# Patient Record
Sex: Female | Born: 1960
Health system: Southern US, Community
[De-identification: ages and names within clinical notes are randomized; demographics above are authoritative.]

## PROBLEM LIST (undated history)

## (undated) DIAGNOSIS — T4145XA Adverse effect of unspecified anesthetic, initial encounter: Secondary | ICD-10-CM

## (undated) DIAGNOSIS — K8020 Calculus of gallbladder without cholecystitis without obstruction: Secondary | ICD-10-CM

## (undated) DIAGNOSIS — G4700 Insomnia, unspecified: Secondary | ICD-10-CM

## (undated) DIAGNOSIS — F32A Depression, unspecified: Secondary | ICD-10-CM

## (undated) DIAGNOSIS — F329 Major depressive disorder, single episode, unspecified: Secondary | ICD-10-CM

## (undated) DIAGNOSIS — G43909 Migraine, unspecified, not intractable, without status migrainosus: Secondary | ICD-10-CM

## (undated) DIAGNOSIS — E785 Hyperlipidemia, unspecified: Secondary | ICD-10-CM

## (undated) DIAGNOSIS — Z90711 Acquired absence of uterus with remaining cervical stump: Secondary | ICD-10-CM

## (undated) DIAGNOSIS — N390 Urinary tract infection, site not specified: Secondary | ICD-10-CM

## (undated) DIAGNOSIS — D649 Anemia, unspecified: Secondary | ICD-10-CM

## (undated) DIAGNOSIS — T8859XA Other complications of anesthesia, initial encounter: Secondary | ICD-10-CM

## (undated) HISTORY — PX: MYOMECTOMY: SHX85

## (undated) HISTORY — PX: LAPAROSCOPIC HYSTERECTOMY: SHX1926

## (undated) HISTORY — PX: TONSILLECTOMY: SUR1361

## (undated) HISTORY — DX: Hyperlipidemia, unspecified: E78.5

## (undated) HISTORY — DX: Major depressive disorder, single episode, unspecified: F32.9

## (undated) HISTORY — PX: OTHER SURGICAL HISTORY: SHX169

## (undated) HISTORY — DX: Anemia, unspecified: D64.9

## (undated) HISTORY — PX: ABDOMINAL HYSTERECTOMY: SHX81

## (undated) HISTORY — PX: BLEPHAROPLASTY: SUR158

## (undated) HISTORY — PX: WISDOM TOOTH EXTRACTION: SHX21

## (undated) HISTORY — DX: Insomnia, unspecified: G47.00

## (undated) HISTORY — DX: Urinary tract infection, site not specified: N39.0

## (undated) HISTORY — PX: POLYPECTOMY: SHX149

## (undated) HISTORY — DX: Depression, unspecified: F32.A

---

## 2003-09-05 ENCOUNTER — Other Ambulatory Visit: Admission: RE | Admit: 2003-09-05 | Discharge: 2003-09-05 | Payer: Self-pay | Admitting: *Deleted

## 2005-07-18 ENCOUNTER — Inpatient Hospital Stay (HOSPITAL_COMMUNITY): Admission: RE | Admit: 2005-07-18 | Discharge: 2005-07-22 | Payer: Self-pay | Admitting: Psychiatry

## 2005-07-18 ENCOUNTER — Emergency Department (HOSPITAL_COMMUNITY): Admission: EM | Admit: 2005-07-18 | Discharge: 2005-07-18 | Payer: Self-pay | Admitting: Emergency Medicine

## 2005-07-19 ENCOUNTER — Ambulatory Visit: Payer: Self-pay | Admitting: Psychiatry

## 2010-11-14 ENCOUNTER — Ambulatory Visit: Payer: Self-pay | Admitting: Family Medicine

## 2010-12-12 ENCOUNTER — Ambulatory Visit: Payer: Commercial Managed Care - PPO | Admitting: Family Medicine

## 2010-12-12 NOTE — Patient Instructions (Addendum)
-   Continue THREE meals a day and 1-2 snacks.  No more than 5 hours without eating.  - Continue your exercise routine, which is great! - Goal:  Obtain twice as many veg's as protein or carbohydrate foods for both lunch and dinner.   Salads, soups,  - Keys to success (veg's):  Keeping well stocked (carrots, cabbage), frozen veg's, chicken broth ([place] low-sodium), canned soup; commit to veg's X times per week (including X time per week of a new veg).   - List veg's you might be willing to try if prepared a certain way and in small quantities.  Work from this list to have a new veg 3 X wk.   - Suggestion:  Stir-fry kale or chard in olive oil and garlic.  Also use your microwave for veg's, and roast veg's sometimes (400 degrees, sprayed with oil &/or covered).   - Make a list of 7 meals that are easy to prepare, taste good, and incorporate veg's.  Use this list as a base for shopping.  Keep in the inside of a kitchen cupboard or on the refrigerator.   - Aim for fish 2 X wk.   - Recommendation:  WILD frozen blueberries.

## 2010-12-12 NOTE — Progress Notes (Signed)
Medical Nutrition Therapy:  Appt start time: 1130 end time:  1230.  Assessment:  Primary concerns today: cholesterol levels. Started with behavior changes Dec 19, after receiving lipid panel results from [facility] health screening.  Changes have included consuming 64 oz water/day, eating more protein and more veg's, increasing sleep, and exercising.   Usual eating pattern includes Meal 3 and 2+ snacks per day.    Avoided foods include: some veg's.  , but Acacia is working on this.   Usual physical activity includes personal trainer for 30 min resistance training 2 X wk, 60 min cardio 4 X wk, 45 min yoga 1 X wk, and aims for 10,000 steps/day.    Progress Towards Goal(s):  In progress.   Nutritional Diagnosis:  NI-5.8.3 Inappropriate intake of types of carbohydrates (specify):  As related to vegetables.  As evidenced by vegetable intake of no more than one/day on average.   Intervention:  Nutrition Counseling.    Monitoring/Evaluation:  Dietary intake, body weight, and physical activity in 1 month.

## 2011-01-06 ENCOUNTER — Ambulatory Visit (INDEPENDENT_AMBULATORY_CARE_PROVIDER_SITE_OTHER): Payer: Commercial Managed Care - PPO | Admitting: Family Medicine

## 2011-01-06 ENCOUNTER — Encounter: Payer: Self-pay | Admitting: Family Medicine

## 2011-01-06 DIAGNOSIS — D509 Iron deficiency anemia, unspecified: Secondary | ICD-10-CM

## 2011-01-06 DIAGNOSIS — G4700 Insomnia, unspecified: Secondary | ICD-10-CM

## 2011-01-06 DIAGNOSIS — F341 Dysthymic disorder: Secondary | ICD-10-CM

## 2011-01-06 DIAGNOSIS — E785 Hyperlipidemia, unspecified: Secondary | ICD-10-CM

## 2011-01-06 DIAGNOSIS — F329 Major depressive disorder, single episode, unspecified: Secondary | ICD-10-CM

## 2011-01-06 DIAGNOSIS — G2581 Restless legs syndrome: Secondary | ICD-10-CM

## 2011-01-06 DIAGNOSIS — Z87440 Personal history of urinary (tract) infections: Secondary | ICD-10-CM

## 2011-01-07 ENCOUNTER — Other Ambulatory Visit: Payer: Commercial Managed Care - PPO

## 2011-01-07 NOTE — Progress Notes (Signed)
**Note Megan-Identified via Obfuscation** There were no orders in for Megan Martin, we paged Megan Martin and before she called back, Megan Martin stated she had to leave in order to go to work. So no labs were drawn for this pt. AC

## 2011-01-12 ENCOUNTER — Encounter: Payer: Self-pay | Admitting: Family Medicine

## 2011-01-12 DIAGNOSIS — F329 Major depressive disorder, single episode, unspecified: Secondary | ICD-10-CM | POA: Insufficient documentation

## 2011-01-12 DIAGNOSIS — Z87440 Personal history of urinary (tract) infections: Secondary | ICD-10-CM | POA: Insufficient documentation

## 2011-01-12 DIAGNOSIS — G4700 Insomnia, unspecified: Secondary | ICD-10-CM | POA: Insufficient documentation

## 2011-01-12 DIAGNOSIS — E785 Hyperlipidemia, unspecified: Secondary | ICD-10-CM | POA: Insufficient documentation

## 2011-01-12 DIAGNOSIS — D509 Iron deficiency anemia, unspecified: Secondary | ICD-10-CM | POA: Insufficient documentation

## 2011-01-12 DIAGNOSIS — G2581 Restless legs syndrome: Secondary | ICD-10-CM | POA: Insufficient documentation

## 2011-01-12 NOTE — Progress Notes (Signed)
  Subjective:    Patient ID: Megan Martin, female    DOB: 19-Dec-1960, 50 y.o.   MRN: [id number]  HPI Patient is here to meet new PCP and discuss hyperlipidemia.  Patient was referred to me by Dr. Gerilyn Pilgrim.  She recently had FLP done at her work which showed LDL 156, TC 227, HDL 58, and T 67.  Patient wants to establish a PCP to manage chronic issues.  Patient is working with Dr. Gerilyn Pilgrim to manage diet and lifestyle modification.  She does not want to start medication at this time.  She wants to lose weight and recheck FLP in 6 months.  Patient has an established gynecologist and psychologist.      Review of Systems Denies fever, chills, sweats, chest pain, N/V.  Denies abdominal pain, myalgias, constipation/diarrhea, dysuria.  Denies any recent hospitalizations, ED visits.    Objective:   Physical Exam  Constitutional: She appears well-developed and well-nourished.  HENT:  Head: Normocephalic and atraumatic.  Cardiovascular: Normal rate and regular rhythm.  Exam reveals no gallop and no friction rub.   No murmur heard. Pulmonary/Chest: Effort normal and breath sounds normal. She has no wheezes. She has no rales.  Abdominal: Soft. Bowel sounds are normal. She exhibits no distension. There is no tenderness.  Skin: Skin is warm and dry.          Assessment & Plan:

## 2011-01-12 NOTE — Assessment & Plan Note (Signed)
Discussed lipid panel results with patient.  She voices understanding.  States that she plans on eating healthy foods and increasing exercise.  Says she has gained >25 lbs in 1 year and wants to lose weight.  Will recheck FLP in 6 months.  May consider starting low dose statin at that time if cholesterol does not improve.

## 2011-01-13 ENCOUNTER — Encounter: Payer: Self-pay | Admitting: Family Medicine

## 2011-01-13 ENCOUNTER — Ambulatory Visit (INDEPENDENT_AMBULATORY_CARE_PROVIDER_SITE_OTHER): Payer: Commercial Managed Care - PPO | Admitting: Family Medicine

## 2011-01-13 DIAGNOSIS — E785 Hyperlipidemia, unspecified: Secondary | ICD-10-CM

## 2011-01-13 NOTE — Progress Notes (Signed)
Medical Nutrition Therapy:  Appt start time: 1530 end time: 1630.  Assessment:  Primary concerns today: cholesterol levels. Dennys has been getting mostly salads/veg plate when out in restaurants.  She has been successful in getting fish at least once, rather than twice a week.  She has been seeing her trainer for weights 30 min 2 X wk, walk/run/cardio machines 4 X wk.  Still drinking plenty of water, but has not managed to eat veg's more than ~once a day on average.    Progress Towards Goal(s):  In progress.   Nutritional Diagnosis:  NI-5.8.3 Inappropriate intake of types of carbohydrates (specify):  As related to vegetables.  As evidenced by vegetable intake of no more than one/day on average.   Intervention:  Nutrition Counseling.    Monitoring/Evaluation:  Dietary intake, body weight, and physical activity in 1 month.

## 2011-01-13 NOTE — Patient Instructions (Addendum)
-   When you use soup (canned or box), always add frozen veg's to the soup.  (Microwave veg's, then add soup, then reheat.) - Keep frozen veg's on hand both at home and work.   - Goal:  Veg's at least once a day, and twice a day 3 X wk.  Record each day's total in the lower right corner.   - Keep tuna fish on hand (pouch or canned) to add to a salad.  - Protein RDA:  0.8 g per kg body weight = ~50 grams per day.  (Each oz of meat/fish = 7 g protein.) - Other recommended amounts:  No more than 45 g per day (olive/canola oil, nuts, seeds, avocado, fish). - Carbohydrates round out the rest of your diet, i.e., at 2000 kcal:  350 kcal from carb.  Most important is to watch the quality of these carb's.  Best sources of carbohydrate:  Fruit, sweet potatoes and other root veg's (white or sweet potatoes, turnips, carrots/parsnips, onion, garlic, beets).  Try bite-size pieces of veg's; spray with olive oil; roast at 375 or 400 degrees till soft.  Stiff occasionally.  Optional is addn of 1/2 c white wine & 1/4 c balsamic vinegar.   - List of 7-10 meals.

## 2011-02-10 ENCOUNTER — Ambulatory Visit: Payer: Commercial Managed Care - PPO | Admitting: Family Medicine

## 2011-02-19 ENCOUNTER — Other Ambulatory Visit: Payer: Self-pay | Admitting: Family Medicine

## 2011-02-19 DIAGNOSIS — E669 Obesity, unspecified: Secondary | ICD-10-CM

## 2011-02-19 NOTE — Telephone Encounter (Signed)
Pt has never had refills from [facility] and needs 3 refilled- Klonopin 0.5 1-2 as needed at night, Wellbutrin, & pristiq - [facility]  Also was told she needed fasting labs and there are no orders in for her.  Need to call her back when orders are in to sched lab.

## 2011-02-19 NOTE — Telephone Encounter (Signed)
Will forward message to Dr. Tye Savoy.

## 2011-02-21 MED ORDER — CLONAZEPAM 0.5 MG PO TABS: 0.5000 mg | ORAL_TABLET | Freq: Every evening | ORAL | Status: DC | PRN

## 2011-02-21 MED ORDER — BUPROPION HCL ER (SR) 150 MG PO TB12: 150.0000 mg | ORAL_TABLET | Freq: Every day | ORAL | Status: DC

## 2011-02-21 MED ORDER — DESVENLAFAXINE SUCCINATE ER 50 MG PO TB24: 50.0000 mg | ORAL_TABLET | Freq: Every day | ORAL | Status: DC

## 2011-02-21 NOTE — Telephone Encounter (Signed)
Addended by: Tye Savoy, IVY on: 02/21/2011 04:07 PM   Modules accepted: Orders

## 2011-02-21 NOTE — Telephone Encounter (Signed)
Addended by: Tye Savoy, IVY on: 02/21/2011 01:44 PM   Modules accepted: Orders

## 2011-02-21 NOTE — Telephone Encounter (Signed)
Addended by: Tye Savoy, Mystic Labo on: 02/21/2011 01:45 PM   Modules accepted: Orders

## 2011-02-21 NOTE — Telephone Encounter (Signed)
FLP ordered.  Meds e-scribed to outpatient pharmacy.  Please let patient know this is all done.  Thanks.

## 2011-02-24 ENCOUNTER — Other Ambulatory Visit: Payer: Commercial Managed Care - PPO

## 2011-02-25 ENCOUNTER — Other Ambulatory Visit: Payer: Commercial Managed Care - PPO

## 2011-02-25 DIAGNOSIS — E669 Obesity, unspecified: Secondary | ICD-10-CM

## 2011-02-25 LAB — LIPID PANEL
Cholesterol: 256 mg/dL — ABNORMAL HIGH (ref 0–200)
HDL: 55 mg/dL (ref >39)
LDL Cholesterol: 187 mg/dL — ABNORMAL HIGH (ref 0–99)
Total CHOL/HDL Ratio: 4.7 Ratio
Triglycerides: 69 mg/dL (ref <150)
VLDL: 14 mg/dL (ref 0–40)

## 2011-02-25 NOTE — Progress Notes (Signed)
FLP DONE TODAY Mirren Gest 

## 2011-02-26 ENCOUNTER — Telehealth: Payer: Self-pay | Admitting: Family Medicine

## 2011-02-26 NOTE — Telephone Encounter (Signed)
Message copied by DE Michel Bickers on Wed Feb 26, 2011  3:26 PM ------      Message from: Tivis Ringer      Created: Wed Feb 26, 2011 10:31 AM      Regarding: Your patient                   ----- Message -----         From: [facility] [place] Interface         Sent: 02/25/2011   7:49 PM           To: Richrd Prime Hensel

## 2011-02-26 NOTE — Telephone Encounter (Signed)
Will you please call patient and ask to her to schedule a follow up appointment with me to discuss her FLP results?  Thanks.

## 2011-02-27 ENCOUNTER — Telehealth: Payer: Self-pay | Admitting: Family Medicine

## 2011-02-27 NOTE — Telephone Encounter (Signed)
Told pt that Dr. Tye Savoy would like for her to come in to discuss her FLP. Told her that when it was convenient for her to call and schedule an appt for this pt agreed.Laureen Ochs, Viann Shove

## 2011-03-04 ENCOUNTER — Encounter: Payer: Self-pay | Admitting: Family Medicine

## 2011-03-04 MED ORDER — SIMVASTATIN 10 MG PO TABS: 10.0000 mg | ORAL_TABLET | Freq: Every evening | ORAL | Status: AC

## 2011-03-04 NOTE — Progress Notes (Signed)
Addended by: Tye Savoy, Shantana Christon on: 03/04/2011 09:19 AM   Modules accepted: Orders

## 2011-03-31 ENCOUNTER — Emergency Department (HOSPITAL_COMMUNITY)
Admission: EM | Admit: 2011-03-31 | Discharge: 2011-04-01 | Disposition: A | Discharge status: Home / Self Care | Payer: 59 | Attending: Emergency Medicine | Admitting: Emergency Medicine

## 2011-03-31 ENCOUNTER — Emergency Department (HOSPITAL_COMMUNITY): Payer: 59

## 2011-03-31 DIAGNOSIS — F329 Major depressive disorder, single episode, unspecified: Secondary | ICD-10-CM | POA: Insufficient documentation

## 2011-03-31 DIAGNOSIS — R51 Headache: Secondary | ICD-10-CM | POA: Insufficient documentation

## 2011-03-31 DIAGNOSIS — F3289 Other specified depressive episodes: Secondary | ICD-10-CM | POA: Insufficient documentation

## 2011-03-31 DIAGNOSIS — H53149 Visual discomfort, unspecified: Secondary | ICD-10-CM | POA: Insufficient documentation

## 2011-03-31 DIAGNOSIS — R110 Nausea: Secondary | ICD-10-CM | POA: Insufficient documentation

## 2011-03-31 LAB — BASIC METABOLIC PANEL
BUN: 15 mg/dL (ref 6–23)
CO2: 25 mEq/L (ref 19–32)
Calcium: 8.7 mg/dL (ref 8.4–10.5)
Chloride: 99 mEq/L (ref 96–112)
Creatinine, Ser: 0.56 mg/dL (ref 0.4–1.2)
GFR calc Af Amer: >60 mL/min (ref >60)
GFR calc non Af Amer: >60 mL/min (ref >60)
Glucose, Bld: 93 mg/dL (ref 70–99)
Potassium: 4.9 mEq/L (ref 3.5–5.1)
Sodium: 134 mEq/L — ABNORMAL LOW (ref 135–145)

## 2011-03-31 LAB — DIFFERENTIAL
Basophils Absolute: 0 10*3/uL (ref 0.0–0.1)
Basophils Relative: 0 % (ref 0–1)
Eosinophils Absolute: 0.2 10*3/uL (ref 0.0–0.7)
Eosinophils Relative: 2 % (ref 0–5)
Lymphocytes Relative: 27 % (ref 12–46)
Lymphs Abs: 2.3 10*3/uL (ref 0.7–4.0)
Monocytes Absolute: 0.4 10*3/uL (ref 0.1–1.0)
Monocytes Relative: 5 % (ref 3–12)
Neutro Abs: 5.6 10*3/uL (ref 1.7–7.7)
Neutrophils Relative %: 66 % (ref 43–77)

## 2011-03-31 LAB — URINALYSIS, ROUTINE W REFLEX MICROSCOPIC
Bilirubin Urine: NEGATIVE
Glucose, UA: NEGATIVE mg/dL
Hgb urine dipstick: NEGATIVE
Ketones, ur: NEGATIVE mg/dL
Leukocytes, UA: NEGATIVE
Nitrite: NEGATIVE
Protein, ur: NEGATIVE mg/dL
Specific Gravity, Urine: 1.012 (ref 1.005–1.030)
Urobilinogen, UA: 0.2 mg/dL (ref 0.0–1.0)
pH: 7.5 (ref 5.0–8.0)

## 2011-03-31 LAB — CBC
HCT: 41.4 % (ref 36.0–46.0)
Hemoglobin: 13.7 g/dL (ref 12.0–15.0)
MCH: 28.8 pg (ref 26.0–34.0)
MCHC: 33.1 g/dL (ref 30.0–36.0)
MCV: 87.2 fL (ref 78.0–100.0)
Platelets: 280 10*3/uL (ref 150–400)
RBC: 4.75 MIL/uL (ref 3.87–5.11)
RDW: 13.3 % (ref 11.5–15.5)
WBC: 8.5 10*3/uL (ref 4.0–10.5)

## 2011-03-31 LAB — PREGNANCY, URINE: Preg Test, Ur: NEGATIVE

## 2011-04-01 LAB — CORTISOL: Cortisol, Plasma: 21.9 ug/dL

## 2011-05-07 ENCOUNTER — Inpatient Hospital Stay (HOSPITAL_COMMUNITY): Admission: RE | Admit: 2011-05-07 | Payer: 59 | Source: Ambulatory Visit

## 2011-05-07 ENCOUNTER — Other Ambulatory Visit (HOSPITAL_COMMUNITY): Payer: Self-pay | Admitting: Otolaryngology

## 2011-05-07 ENCOUNTER — Encounter (HOSPITAL_COMMUNITY): Payer: Self-pay

## 2011-05-07 ENCOUNTER — Ambulatory Visit (HOSPITAL_COMMUNITY)
Admission: RE | Admit: 2011-05-07 | Discharge: 2011-05-07 | Disposition: A | Discharge status: Home / Self Care | Payer: 59 | Source: Ambulatory Visit | Attending: Otolaryngology | Admitting: Otolaryngology

## 2011-05-07 DIAGNOSIS — J329 Chronic sinusitis, unspecified: Secondary | ICD-10-CM

## 2011-05-07 DIAGNOSIS — R51 Headache: Secondary | ICD-10-CM | POA: Insufficient documentation

## 2011-05-07 HISTORY — DX: Acquired absence of uterus with remaining cervical stump: Z90.711

## 2011-06-06 ENCOUNTER — Other Ambulatory Visit: Payer: Self-pay | Admitting: Family Medicine

## 2011-06-06 NOTE — Telephone Encounter (Signed)
Refill request

## 2011-07-02 ENCOUNTER — Other Ambulatory Visit: Payer: Self-pay | Admitting: Family Medicine

## 2011-07-03 NOTE — Telephone Encounter (Signed)
Refill request

## 2011-08-21 ENCOUNTER — Other Ambulatory Visit: Payer: Self-pay | Admitting: Family Medicine

## 2011-08-22 NOTE — Telephone Encounter (Signed)
Refill request

## 2011-09-09 ENCOUNTER — Telehealth: Payer: Self-pay | Admitting: Family Medicine

## 2011-09-09 NOTE — Telephone Encounter (Signed)
Informed pt that she will need to make an appt before she can have her meds refilled. She stated that she will call back after thanksgiving to make an appt.Laureen Ochs, Viann Shove'

## 2011-09-09 NOTE — Telephone Encounter (Signed)
Hey guys, this patient is asking me to refill her clonazepam.  I've only seen her one time in May and we did not discuss this.  Can you ask her to schedule an appointment with me before I refill her medications?  It looks like I wanted to meet with her to talk about high cholesterol as well.  Thanks.

## 2012-01-18 ENCOUNTER — Inpatient Hospital Stay (HOSPITAL_COMMUNITY): Payer: 59

## 2012-01-18 ENCOUNTER — Encounter (HOSPITAL_COMMUNITY): Payer: Self-pay | Admitting: Internal Medicine

## 2012-01-18 ENCOUNTER — Inpatient Hospital Stay (HOSPITAL_COMMUNITY)
Admission: EM | Admit: 2012-01-18 | Discharge: 2012-01-22 | DRG: 872 | Disposition: A | Discharge status: Home / Self Care | Payer: 59 | Source: Other Acute Inpatient Hospital | Attending: Internal Medicine | Admitting: Internal Medicine

## 2012-01-18 DIAGNOSIS — G4700 Insomnia, unspecified: Secondary | ICD-10-CM

## 2012-01-18 DIAGNOSIS — E785 Hyperlipidemia, unspecified: Secondary | ICD-10-CM

## 2012-01-18 DIAGNOSIS — R0609 Other forms of dyspnea: Secondary | ICD-10-CM | POA: Diagnosis present

## 2012-01-18 DIAGNOSIS — D72829 Elevated white blood cell count, unspecified: Secondary | ICD-10-CM

## 2012-01-18 DIAGNOSIS — F329 Major depressive disorder, single episode, unspecified: Secondary | ICD-10-CM

## 2012-01-18 DIAGNOSIS — R51 Headache: Secondary | ICD-10-CM

## 2012-01-18 DIAGNOSIS — K8020 Calculus of gallbladder without cholecystitis without obstruction: Secondary | ICD-10-CM | POA: Diagnosis present

## 2012-01-18 DIAGNOSIS — J3489 Other specified disorders of nose and nasal sinuses: Secondary | ICD-10-CM | POA: Diagnosis present

## 2012-01-18 DIAGNOSIS — K5289 Other specified noninfective gastroenteritis and colitis: Secondary | ICD-10-CM

## 2012-01-18 DIAGNOSIS — A419 Sepsis, unspecified organism: Principal | ICD-10-CM

## 2012-01-18 DIAGNOSIS — Z87440 Personal history of urinary (tract) infections: Secondary | ICD-10-CM

## 2012-01-18 DIAGNOSIS — D649 Anemia, unspecified: Secondary | ICD-10-CM | POA: Diagnosis present

## 2012-01-18 DIAGNOSIS — I959 Hypotension, unspecified: Secondary | ICD-10-CM | POA: Diagnosis present

## 2012-01-18 DIAGNOSIS — A088 Other specified intestinal infections: Secondary | ICD-10-CM | POA: Diagnosis present

## 2012-01-18 DIAGNOSIS — G2581 Restless legs syndrome: Secondary | ICD-10-CM

## 2012-01-18 DIAGNOSIS — R112 Nausea with vomiting, unspecified: Secondary | ICD-10-CM

## 2012-01-18 DIAGNOSIS — D509 Iron deficiency anemia, unspecified: Secondary | ICD-10-CM

## 2012-01-18 DIAGNOSIS — F3289 Other specified depressive episodes: Secondary | ICD-10-CM | POA: Diagnosis present

## 2012-01-18 DIAGNOSIS — R519 Headache, unspecified: Secondary | ICD-10-CM | POA: Diagnosis present

## 2012-01-18 DIAGNOSIS — R0989 Other specified symptoms and signs involving the circulatory and respiratory systems: Secondary | ICD-10-CM | POA: Diagnosis present

## 2012-01-18 DIAGNOSIS — E876 Hypokalemia: Secondary | ICD-10-CM | POA: Diagnosis present

## 2012-01-18 LAB — URINALYSIS, ROUTINE W REFLEX MICROSCOPIC
Bilirubin Urine: NEGATIVE
Glucose, UA: NEGATIVE mg/dL
Hgb urine dipstick: NEGATIVE
Ketones, ur: 15 mg/dL — AB
Leukocytes, UA: NEGATIVE
Nitrite: NEGATIVE
Protein, ur: NEGATIVE mg/dL
Specific Gravity, Urine: 1.024 (ref 1.005–1.030)
Urobilinogen, UA: 0.2 mg/dL (ref 0.0–1.0)
pH: 5 (ref 5.0–8.0)

## 2012-01-18 LAB — COMPREHENSIVE METABOLIC PANEL
ALT: 13 U/L (ref 0–35)
AST: 14 U/L (ref 0–37)
Albumin: 2.8 g/dL — ABNORMAL LOW (ref 3.5–5.2)
Alkaline Phosphatase: 48 U/L (ref 39–117)
BUN: 17 mg/dL (ref 6–23)
CO2: 21 mEq/L (ref 19–32)
Calcium: 6.9 mg/dL — ABNORMAL LOW (ref 8.4–10.5)
Chloride: 112 mEq/L (ref 96–112)
Creatinine, Ser: 0.56 mg/dL (ref 0.50–1.10)
GFR calc Af Amer: >90 mL/min (ref >90)
GFR calc non Af Amer: >90 mL/min (ref >90)
Glucose, Bld: 115 mg/dL — ABNORMAL HIGH (ref 70–99)
Potassium: 3.3 mEq/L — ABNORMAL LOW (ref 3.5–5.1)
Sodium: 142 mEq/L (ref 135–145)
Total Bilirubin: 0.3 mg/dL (ref 0.3–1.2)
Total Protein: 5.1 g/dL — ABNORMAL LOW (ref 6.0–8.3)

## 2012-01-18 LAB — GLUCOSE, CAPILLARY: Glucose-Capillary: 96 mg/dL (ref 70–99)

## 2012-01-18 LAB — PROCALCITONIN: Procalcitonin: 0.34 ng/mL

## 2012-01-18 LAB — CBC
HCT: 35.6 % — ABNORMAL LOW (ref 36.0–46.0)
Hemoglobin: 11.8 g/dL — ABNORMAL LOW (ref 12.0–15.0)
MCH: 29.1 pg (ref 26.0–34.0)
MCHC: 33.1 g/dL (ref 30.0–36.0)
MCV: 87.9 fL (ref 78.0–100.0)
Platelets: 200 10*3/uL (ref 150–400)
RBC: 4.05 MIL/uL (ref 3.87–5.11)
RDW: 13.1 % (ref 11.5–15.5)
WBC: 8.6 10*3/uL (ref 4.0–10.5)

## 2012-01-18 LAB — LIPASE, BLOOD: Lipase: 13 U/L (ref 11–59)

## 2012-01-18 LAB — MRSA PCR SCREENING: MRSA by PCR: NEGATIVE

## 2012-01-18 LAB — LACTIC ACID, PLASMA: Lactic Acid, Venous: 1 mmol/L (ref 0.5–2.2)

## 2012-01-18 MED ORDER — MORPHINE SULFATE 2 MG/ML IJ SOLN
2.0000 mg | INTRAMUSCULAR | Status: DC | PRN
  Filled 2012-01-18: qty 1

## 2012-01-18 MED ORDER — BUPROPION HCL ER (XL) 150 MG PO TB24
150.0000 mg | ORAL_TABLET | Freq: Every day | ORAL | Status: DC
  Administered 2012-01-18 – 2012-01-21 (×4): 150 mg via ORAL
  Filled 2012-01-18 (×6): qty 1

## 2012-01-18 MED ORDER — METHYLPREDNISOLONE SODIUM SUCC 40 MG IJ SOLR
40.0000 mg | Freq: Once | INTRAMUSCULAR | Status: AC
  Administered 2012-01-18: 40 mg via INTRAVENOUS
  Filled 2012-01-18: qty 1

## 2012-01-18 MED ORDER — METRONIDAZOLE IN NACL 5-0.79 MG/ML-% IV SOLN
500.0000 mg | Freq: Three times a day (TID) | INTRAVENOUS | Status: DC
  Administered 2012-01-18 – 2012-01-21 (×11): 500 mg via INTRAVENOUS
  Filled 2012-01-18 (×13): qty 100

## 2012-01-18 MED ORDER — IOHEXOL 300 MG/ML  SOLN
100.0000 mL | Freq: Once | INTRAMUSCULAR | Status: AC | PRN
  Administered 2012-01-18: 100 mL via INTRAVENOUS

## 2012-01-18 MED ORDER — ACETAMINOPHEN 325 MG PO TABS
650.0000 mg | ORAL_TABLET | Freq: Four times a day (QID) | ORAL | Status: DC | PRN
  Administered 2012-01-18 – 2012-01-20 (×3): 650 mg via ORAL
  Filled 2012-01-18 (×3): qty 2

## 2012-01-18 MED ORDER — ACETAMINOPHEN 650 MG RE SUPP: 650.0000 mg | Freq: Four times a day (QID) | RECTAL | Status: DC | PRN

## 2012-01-18 MED ORDER — ONDANSETRON HCL 4 MG/2ML IJ SOLN
4.0000 mg | Freq: Four times a day (QID) | INTRAMUSCULAR | Status: DC | PRN
  Administered 2012-01-18 – 2012-01-21 (×7): 4 mg via INTRAVENOUS
  Filled 2012-01-18 (×7): qty 2

## 2012-01-18 MED ORDER — SALINE SPRAY 0.65 % NA SOLN
1.0000 | NASAL | Status: DC | PRN
  Administered 2012-01-19 – 2012-01-20 (×2): 1 via NASAL
  Filled 2012-01-18: qty 44

## 2012-01-18 MED ORDER — SODIUM CHLORIDE 0.9 % IJ SOLN
3.0000 mL | Freq: Two times a day (BID) | INTRAMUSCULAR | Status: DC
  Administered 2012-01-18 – 2012-01-21 (×6): 3 mL via INTRAVENOUS

## 2012-01-18 MED ORDER — ACETAMINOPHEN 500 MG PO TABS
1000.0000 mg | ORAL_TABLET | Freq: Once | ORAL | Status: AC
  Administered 2012-01-18: 1000 mg via ORAL
  Filled 2012-01-18: qty 2

## 2012-01-18 MED ORDER — HYDROMORPHONE HCL PF 1 MG/ML IJ SOLN
0.5000 mg | INTRAMUSCULAR | Status: DC | PRN
  Administered 2012-01-18 – 2012-01-19 (×6): 0.5 mg via INTRAVENOUS
  Filled 2012-01-18 (×6): qty 1

## 2012-01-18 MED ORDER — SODIUM CHLORIDE 0.9 % IV BOLUS (SEPSIS)
1000.0000 mL | Freq: Once | INTRAVENOUS | Status: AC
  Administered 2012-01-18: 1000 mL via INTRAVENOUS

## 2012-01-18 MED ORDER — SODIUM CHLORIDE 0.9 % IV BOLUS (SEPSIS)
500.0000 mL | Freq: Once | INTRAVENOUS | Status: AC
  Administered 2012-01-18: 500 mL via INTRAVENOUS

## 2012-01-18 MED ORDER — OXYMETAZOLINE HCL 0.05 % NA SOLN
1.0000 | Freq: Two times a day (BID) | NASAL | Status: DC
  Administered 2012-01-18 – 2012-01-21 (×6): 1 via NASAL
  Filled 2012-01-18: qty 15

## 2012-01-18 MED ORDER — IOHEXOL 300 MG/ML  SOLN
20.0000 mL | INTRAMUSCULAR | Status: AC
  Administered 2012-01-18 (×2): 20 mL via ORAL

## 2012-01-18 MED ORDER — ONDANSETRON HCL 4 MG PO TABS: 4.0000 mg | ORAL_TABLET | Freq: Four times a day (QID) | ORAL | Status: DC | PRN

## 2012-01-18 MED ORDER — PROMETHAZINE HCL 25 MG/ML IJ SOLN
25.0000 mg | Freq: Four times a day (QID) | INTRAMUSCULAR | Status: DC | PRN
  Administered 2012-01-18 – 2012-01-21 (×4): 25 mg via INTRAVENOUS
  Filled 2012-01-18 (×5): qty 1

## 2012-01-18 MED ORDER — HYDROMORPHONE HCL PF 1 MG/ML IJ SOLN
INTRAMUSCULAR | Status: AC
  Administered 2012-01-18: 0.5 mg
  Filled 2012-01-18: qty 1

## 2012-01-18 MED ORDER — CIPROFLOXACIN IN D5W 400 MG/200ML IV SOLN
400.0000 mg | Freq: Two times a day (BID) | INTRAVENOUS | Status: DC
  Administered 2012-01-18 – 2012-01-19 (×4): 400 mg via INTRAVENOUS
  Filled 2012-01-18 (×5): qty 200

## 2012-01-18 MED ORDER — ENOXAPARIN SODIUM 40 MG/0.4ML ~~LOC~~ SOLN: 40.0000 mg | SUBCUTANEOUS | Status: DC

## 2012-01-18 MED ORDER — SODIUM CHLORIDE 0.9 % IV SOLN
INTRAVENOUS | Status: DC
  Administered 2012-01-18 (×3): via INTRAVENOUS

## 2012-01-18 MED ORDER — HEPARIN SODIUM (PORCINE) 5000 UNIT/ML IJ SOLN
5000.0000 [IU] | Freq: Three times a day (TID) | INTRAMUSCULAR | Status: DC
Start: 2012-01-18 — End: 2012-01-22
  Administered 2012-01-18 – 2012-01-22 (×13): 5000 [IU] via SUBCUTANEOUS
  Filled 2012-01-18 (×16): qty 1

## 2012-01-18 NOTE — Progress Notes (Signed)
PCCM follow-up Note   Subjective: HPI:  51 year old female admitted 3/31 for apparent gastroenteritis. Patient was in good health up until 3/28 when she began to feel malaise. On 3/29, or malaise continued and she had [place] pizza which she has never had before. At 2 PM on 3/30, she began having projectile vomiting and persistent watery diarrhea. She developed fever. She went to the outside hospital where lumbar puncture was performed. She is not aware of any prior illness similar to this.   OVERNIGHT- BP and fever improved. Still weak and uncomfortable.   Objective: Vital signs in last 24 hours: Temp:  [99.4 F (37.4 C)-101.6 F (38.7 C)] 99.4 F (37.4 C) (03/31 0800) Pulse Rate:  [104-120] 109  (03/31 1000) Resp:  [10-20] 20  (03/31 1000) BP: (89-123)/(49-79) 98/65 mmHg (03/31 1000) SpO2:  [94 %-99 %] 99 % (03/31 1000) Weight:  [60.9 kg (134 lb 4.2 oz)] 60.9 kg (134 lb 4.2 oz) (03/31 0515)  Physical exam  Gen.Megan Martin female. Patient is in moderate distress, alert but tending to keep her eyes closed as if in pain. Brown/ not coffee ground material in emesis basin. HEENT: PERRL, EOMI, no icterus. Mucous membranes are dry. No conjunctival injection  Neck: Patient is able to touch her chin to her chest. No cervical lymphadenopathy.  CV: Mild tachycardia, regular rate, no murmurs rubs gallops  Pulmonary: Clear to auscultation bilaterally, no respiratory distress on room air  GI: Minimal tenderness in the suprapubic region. Soft abdomen. No epigastric or right upper quadrant tenderness.  Extremities: No edema, no joint effusions.  Skin: Warm, no rash   Lab Results:  [facility] 01/18/12 0728  WBC 8.6  HGB 11.8*  HCT 35.6*  PLT 200   BMET  Basename 01/18/12 0728  NA 142  K 3.3*  CL 112  CO2 21  GLUCOSE 115*  BUN 17  CREATININE 0.56  CALCIUM 6.9*    Studies/Results: No results found.  Medications:  reviewed  Assessment/Plan: Gastroenteritis- c/w viral/ [place].   Sepsis Syndrome- BP and fever are responding to initial care/ rehydration.  Plan- Discussed w/ Dr Butler Denmark. PCCM will sign off.  Please call if needed.  LOS: 0 days   Lilliahna Schubring D 01/18/2012, 10:55 AM

## 2012-01-18 NOTE — H&P (Signed)
PCP:  Pt identifies Megan Martin with [facility]. This was not identified until after admission.    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   Pt is reliable historian, states she was in usual state of overall good health until  Thursday, after her shift here at [employer] she developed a generalized non-focal headache  that is abnormal for her. Friday had some malaise but overall well, then Saturday  went down to [place] to visit family and then developed sudden onset of severe nausea,  vomiting, and diarrhea. She estimates two dozen episodes of n/v/d through Saturday,  no hematemesis, melena, BRBPR, with associated mild crampy abdominal pain that feels  like burning. She identifies a sick contact in the critical care dept at work, but  denies any exotic food exposures, no raw foods, and denies any recent antiobiotic  courses.   She went to [place] where BP was noted to be low in the 90's, tachycardic, but no  reported fevers. Labs (see below) significant for WBC 14, normal chem/renal fxn,  negative UA. While there, she also noted headache, neck and back pain, and possibly  with some photophobia, and therefore had a head CT and LP that were both completely  unremarkable (0 RBC and 0 WBC's, negative Gstain). There, pt was given morphine,  zofran, 2L NS, toradol IV, pepcid IV, 2g ceftriaxone, phenergan IV. She is  transferred to [facility] for further care.   Here, she still has the headache and feels chills. She denies any cardiopulmonary  symptoms, no cough, no dysuria. ROS is otherwise unremarkable.   Past Medical History  Diagnosis Date  . Anemia   . Depression   . Neuromuscular disorder     Pt denies any history of this at all   . Chronic kidney  disease     Unclear where this diagnosis came from   . Insomnia   . S/P partial hysterectomy     Past Surgical History  Procedure Date  . Cesarean section 1982, 1984  . Laparoscopic hysterectomy     still has cervix, ovaries, 1 tube  . Tonsillectomy   . Myomectomy   . Wisdom tooth extraction     Medications:  HOME MEDS: Reconciled by name with pt, only endorses klonopin and wellbutrin Prior to Admission medications   Medication Sig Start Date End Date Taking? Authorizing Provider  buPROPion (WELLBUTRIN XL) 150 MG 24 hr tablet Take 150 mg by mouth daily.      Barnabas Lister, MD  buPROPion (WELLBUTRIN XL) 150 MG 24 hr tablet TAKE 1 TABLET BY MOUTH DAILY 07/02/11   Barnabas Lister, MD  Calcium Carbonate-Vit D-Min 600-400 MG-UNIT TABS Take 1 tablet by mouth 1 dose over 46 hours.      Historical Provider, MD  clonazePAM (KLONOPIN) 0.5 MG tablet TAKE 1 TABLET BY MOUTH AT BEDTIME AS NEEDED 06/06/11   Ivy de Lawson Radar, MD  estradiol (VIVELLE-DOT) 0.1 MG/24HR Place 1 patch onto the skin 2 (two) times a week.      Historical Provider, MD  Multiple Vitamins-Minerals (CENTRUM SILVER PO) Take 1 tablet by mouth 1 dose over 46 hours.      Historical Provider, MD  PRISTIQ 50 MG 24 hr tablet TAKE 1 TABLET BY MOUTH DAILY 08/21/11  Ivy de Lawson Radar, MD  tretinoin (RETIN-A) 0.025 % cream Apply topically at bedtime.      Historical Provider, MD  Vitamin D, Cholecalciferol, 400 UNITS CHEW Chew 1 tablet by mouth 1 dose over 46 hours.      Historical Provider, MD    Allergies:  Allergies  Allergen Reactions  . Demerol     Social History:   reports that she has never smoked. She does not have any smokeless tobacco history on file. She reports that she does not drink alcohol or use illicit drugs.  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 Is active, ambulatory without cane or walker. Divorced, has two children.   Family History: Family History  Problem Relation Age of Onset  .  Heart disease Mother   . Hypertension Mother   . Heart disease Father   . Early death Father   . Heart disease Sister   . Hypertension Brother   . Hyperlipidemia Brother     Physical Exam: Filed Vitals:   01/18/12 0309  BP: 91/55  Pulse: 107  Temp: 100.6 F (38.1 C)  TempSrc: Oral  Resp: 18  SpO2: 94%   Blood pressure 91/55, pulse 107, temperature 100.6 F (38.1 C), temperature source Oral, resp. rate 18, SpO2 94.00%. Gen: Young, overall healthy and robust but currently pretty ill appearing, toxic, appears  very fatigued and is grimacing constantly, but is not obtunded and can provider her  own history. Holding head to forehead. Breathing comfortably, no increased WOB.  HEENT: Pupils round and reactive, sclera and conjunctivae are injected, EOMI. Mouth  and tongue are very dry appearing. Lungs: CTAB no w/c/r, good air movement, overall normal exam Heart: Tachycardic but regular, without m/g appreciated.  Abd: Soft, not rigid or peritoneal, BS+, does endorse subjective TTP in the low  midline but no frank facial grimacing or overwhelming tenderness Extrem: Very warm. Radials bounding and fast. No BLE edema noted. Normal bulk and  tone Neuro: Alert, attentive to conversation despite being ill, CN 2-12 intact, no facial  droop or slurring, moves extremities on her own, reaches for phone and picks it up,  able to sit up in bed on her own. Grossly non focal.   Labs at [facility]:  WBC 14.5 with 92.4% neutros Hct 41.3 Plts 222 UA trace protein, ketones, blood with 0-2 RBC. 3-5 WBC, moderate bacteria, negative  nitrite, negative LE.   141   108    22 -----------------< 92 4.1   26     0.6  CSF: glucose 58, protein 23 RBC 0 and WBC 0. Gram stain no organisms, epithelials, no WBC's seen (in both tube 1  and 2).   Blood cultures x2 drawn CT head: negative.    Impression Present on Admission:  .Sepsis .Nausea and vomiting .Leukocytosis .Headache  50yoF with no  major medical comorbidities transferred from [facility] (pt is a [employer] employee in critical care dept), where she presented with  headache, nausea and vomiting, and found to have sepsis physiology, WBC 14.   1. Fevers, leukocytosis, hypoTN, tachycardia: Pt meets sepsis criteria, from likely  GI source -- UA clear and no pulmonary complaints, nothing else on ROS to suggest  other source at present. She does not have evidence of end organ damage at present.  DDx includes severe norovirus infection (hospital employee, pt identifies sick  contact) vs a bacterial/staph food poisoning given the sudden onset of symptoms vs  other infectious gastroenteritis/colitis. CDiff  considered, but pt without any recent  ABx course.   Although overall I suspect this is probably a viral source, given her septic  presentation imaging her abdomen is reasonable, as is treating with empiric ABx for  presumed GI source.   - Moving to SDU. Aggressive IVF's. Start cipro / flagyl, hold on vanco for now but  add if not improving. Clear liquids. CDiff and stool culture.  - Call [facility] to f/u the BCx's that are pending there - Lactate, procalcitonin, repeat CBC/diff, get LFT's, lipase, blood culture - CXR was done at [facility], result not available, but pt without pulmonary complaint, and  symptoms overwhelmingly point toward GI source -- will not repeat here.   2. Headache/neck and back ache: Suspect these are non-specific symptoms due to  dehydration/infection, and not the primary source. CT head and LP were both very  unremarkable.  - Symtpom control  3. Medication reconciliation: continue home wellbutrin. Needs official pharmacy med  rec, do not think she is taking desvenlafaxine. Holding all other non essential meds.   SubQ heparin SDU, MC team 1 Presumed full code  Other plans as per orders.  Critical care time: 60 minutes.   Nolia Tschantz 01/18/2012, 4:19 AM   Of note, pt is  primary patient of [employer]. Will relay this to AM team to have them call PCP to see if they would like to attend. Overnight, pt is ill, [facility] to manage.

## 2012-01-18 NOTE — Progress Notes (Signed)
[facility]  Subjective: History reviewed.  Currently she c/o pain, pointing to suprapubic area and vomiting about 10 min ago. Having trouble keeping down the contrast.   Objective: Blood pressure 98/65, pulse 109, temperature 99.4 F (37.4 C), temperature source Oral, resp. rate 20, height 5\' 2"  (1.575 m), weight 60.9 kg (134 lb 4.2 oz), SpO2 99.00%. Weight change:   Intake/Output Summary (Last 24 hours) at 01/18/12 1117 Last data filed at 01/18/12 0900  Gross per 24 hour  Intake   1585 ml  Output    300 ml  Net   1285 ml    Physical Exam: General appearance: alert, cooperative and moderate distress Lungs: clear to auscultation bilaterally Heart: regular rate and rhythm, S1, S2 normal, no murmur, click, rub or gallop Abdomen: soft, no gaurding, mild tenderness in suprapubic area, BS prestent.  Extremities: extremities normal, atraumatic, no cyanosis or edema  Lab Results:  [facility] 01/18/12 0728  NA 142  K 3.3*  CL 112  CO2 21  GLUCOSE 115*  BUN 17  CREATININE 0.56  CALCIUM 6.9*  MG --  PHOS --    Basename 01/18/12 0728  AST 14  ALT 13  ALKPHOS 48  BILITOT 0.3  PROT 5.1*  ALBUMIN 2.8*    Basename 01/18/12 0728  LIPASE 13  AMYLASE --    Basename 01/18/12 0728  WBC 8.6  NEUTROABS --  HGB 11.8*  HCT 35.6*  MCV 87.9  PLT 200   No results found for this basename: CKTOTAL:3,CKMB:3,CKMBINDEX:3,TROPONINI:3 in the last 72 hours No components found with this basename: POCBNP:3 No results found for this basename: DDIMER:2 in the last 72 hours No results found for this basename: HGBA1C:2 in the last 72 hours No results found for this basename: CHOL:2,HDL:2,LDLCALC:2,TRIG:2,CHOLHDL:2,LDLDIRECT:2 in the last 72 hours No results found for this basename: TSH,T4TOTAL,FREET3,T3FREE,THYROIDAB in the last 72 hours No results found for this basename: VITAMINB12:2,FOLATE:2,FERRITIN:2,TIBC:2,IRON:2,RETICCTPCT:2 in the last 72 hours  Micro Results: Recent  Results (from the past 240 hour(s))  MRSA PCR SCREENING     Status: Normal   Collection Time   01/18/12  5:06 AM      Component Value Range Status Comment   MRSA by PCR NEGATIVE  NEGATIVE  Final     Studies/Results: No results found.  Medications: Scheduled Meds:   . acetaminophen  1,000 mg Oral Once  . buPROPion  150 mg Oral Daily  . ciprofloxacin  400 mg Intravenous Q12H  . heparin  5,000 Units Subcutaneous Q8H  . HYDROmorphone      . iohexol  20 mL Oral Q1 Hr x 2  . metronidazole  500 mg Intravenous Q8H  . sodium chloride  1,000 mL Intravenous Once  . sodium chloride  500 mL Intravenous Once  . sodium chloride  3 mL Intravenous Q12H  . DISCONTD: enoxaparin (LOVENOX) injection  40 mg Subcutaneous Q24H   Continuous Infusions:   . sodium chloride 125 mL/hr at 01/18/12 0920   PRN Meds:.acetaminophen, acetaminophen, HYDROmorphone (DILAUDID) injection, ondansetron (ZOFRAN) IV, ondansetron, promethazine, DISCONTD: morphine  Assessment/Plan: Principal Problem:  *Sepsis- fever/ tachycardia BP improving with IVF. No need for pressors. Appreciate PCCM eval.  LP negative.  CT head negative.    Nausea and vomiting- Gastroenteritis? Viral vs Bacterial. CT and C.diff pending.  On Cipro and Flagyl IV.  Cont IV hydration/ pain and nausea control.  Frontal headache with nasal congestion Afrin and Saline PRN.    Leukocytosis WBC 14 with a left shift (92.5% neutrophils) now down to 10.  I have spoken with Dr Jones Bales and have informed him she is a [facility] patient. He will see her later today.    LOS: 0 days   [facility] [phone] 01/18/2012, 11:17 AM

## 2012-01-18 NOTE — Consult Note (Signed)
Patient name: Megan Martin Medical record number: [id number] Date of birth: 03-30-1961 Age: 51 y.o. Gender: female PCP: DE Michel Bickers, MD, MD  Date: 01/18/2012 Reason for Consult: hypotension Referring Physician: Rizwan   Lines/tubes none  Culture data/sepsis markers procalcitonin pending 3/31 BCx 3/31 UCx 3/31 UA   Antibiotics cipro 3/31>>> Metronidazole 3/31>>>  Best practice GI ppx: not indicated DVT ppx: will start LMWH  Protocols/consults  Events/studies   HPI:   51 year old female admitted 3/31 for apparent gastroenteritis. Patient was in good health up until 3/28 when she began to feel malaise. On 3/29, or malaise continued and she had [place] pizza which she has never had before. At 2 PM on 3/30, she began having projectile vomiting and persistent watery diarrhea. She developed fever. She went to the outside hospital where lumbar puncture was performed. She is not aware of any prior illness similar to this. However in the past she has had recurrent urinary tract infections which presented with low back pain and suprapubic pain which she missed having now. She is not aware of any sick contacts. She currently complains of a bad headache and persistent low back pain and some suprapubic pain.    Past Medical History  Diagnosis Date  . Anemia   . Depression   . Neuromuscular disorder     Pt denies any history of this at all   . Chronic kidney disease     Unclear where this diagnosis came from   . Insomnia   . S/P partial hysterectomy     Past Surgical History  Procedure Date  . Cesarean section 1982, 1984  . Laparoscopic hysterectomy     still has cervix, ovaries, 1 tube  . Tonsillectomy   . Myomectomy   . Wisdom tooth extraction     Family History  Problem Relation Age of Onset  . Heart disease Mother   . Hypertension Mother   . Heart disease Father   . Early death Father   . Heart disease Sister   . Hypertension Brother   . Hyperlipidemia  Brother     Social History:  reports that she has never smoked. She does not have any smokeless tobacco history on file. She reports that she does not drink alcohol or use illicit drugs.  Allergies:  Allergies  Allergen Reactions  . Demerol     Medications:  Prior to Admission medications   Medication Sig Start Date End Date Taking? Authorizing Provider  buPROPion (WELLBUTRIN XL) 150 MG 24 hr tablet Take 150 mg by mouth daily.      Barnabas Lister, MD  buPROPion (WELLBUTRIN XL) 150 MG 24 hr tablet TAKE 1 TABLET BY MOUTH DAILY 07/02/11   Barnabas Lister, MD  Calcium Carbonate-Vit D-Min 600-400 MG-UNIT TABS Take 1 tablet by mouth 1 dose over 46 hours.      Historical Provider, MD  clonazePAM (KLONOPIN) 0.5 MG tablet TAKE 1 TABLET BY MOUTH AT BEDTIME AS NEEDED 06/06/11   Ivy de Lawson Radar, MD  estradiol (VIVELLE-DOT) 0.1 MG/24HR Place 1 patch onto the skin 2 (two) times a week.      Historical Provider, MD  Multiple Vitamins-Minerals (CENTRUM SILVER PO) Take 1 tablet by mouth 1 dose over 46 hours.      Historical Provider, MD  PRISTIQ 50 MG 24 hr tablet TAKE 1 TABLET BY MOUTH DAILY 08/21/11   Ivy de Lawson Radar, MD  tretinoin (RETIN-A) 0.025 % cream Apply topically at bedtime.  Historical Provider, MD  Vitamin D, Cholecalciferol, 400 UNITS CHEW Chew 1 tablet by mouth 1 dose over 46 hours.      Historical Provider, MD    A comprehensive review of systems was negative.  Temp:  [100.6 F (38.1 C)] 100.6 F (38.1 C) (03/31 0309) Pulse Rate:  [107] 107  (03/31 0309) Resp:  [18] 18  (03/31 0309) BP: (91)/(55) 91/55 mmHg (03/31 0309) SpO2:  [94 %] 94 % (03/31 0309)   No intake or output data in the 24 hours ending 01/18/12 0425  Physical exam Gen.Cliffton Asters female. Patient is in moderate distress, alert but tending to keep her eyes closed as if in pain HEENT: PERRL, EOMI, no icterus. Mucous membranes are dry. Neck: Patient is able to touch her chin to her chest. No cervical lymphadenopathy. CV:  Mild tachycardia, regular rate, no murmurs rubs gallops Pulmonary: Clear to auscultation bilaterally, no respiratory distress on room air GI: Minimal tenderness in the suprapubic region. Soft abdomen. No epigastric or right upper quadrant tenderness. Extremities: No edema, no joint effusions. Skin: Warm, no rash  radiology     LAB RESULT Lab Results  Component Value Date   CREATININE 0.56 03/31/2011   BUN 15 03/31/2011   NA 134* 03/31/2011   K 4.9 03/31/2011   CL 99 03/31/2011   CO2 25 03/31/2011   Lab Results  Component Value Date   WBC 8.5 03/31/2011   HGB 13.7 03/31/2011   HCT 41.4 03/31/2011   MCV 87.2 03/31/2011   PLT 280 03/31/2011   No results found for this basename: ALT, AST, GGT, ALKPHOS, BILITOT   No results found for this basename: INR, PROTIME     Assessment and Plan  Likely sepsis from viral gastroenteritis (eg, [place] virus) -Patient is febrile (100.6), tachycardic (107), mildly hypotensive (91/55; patient's baseline SBP is 115 per her report) -Agree with empiric antibiotics (Cipro, Flagyl), but if procalcitonin low, would probably discontinue antibiotics -Would continue liberal fluid resuscitation given that her blood pressure is lower than her baseline and she has no cardiac history -Agree with stool culture and C. difficile antigen assay, however suspicion low given lack of leukocytosis -I have added a UA and urine culture -As the patient is mentating well at this time, there does not appear to be in immediate need to transfer the patient to the ICU -We can place central line or arterial line if blood pressure drops   Total critical care time spent: >30 min  Kennett Symes 01/18/2012, 4:25 AM

## 2012-01-18 NOTE — Progress Notes (Addendum)
Pt was transferred from [facility] to [facility], room # [phone]. Pt is alert and oriented x 4, very weak with complaints of of N/V/D. Pt's Bp was soft, tachy and did have a temp. Admitting Md was paged who came and saw pt and after his assessment, decided to transfer pt to the step-down unit. Rapid response was paged per protocol to be notified, but their phone was busy. IV NS bolus was hanged per Md's order, report was called to the receiving RN on 2600, and pt was subsequently transferred to the step-down unit. ------Nghia Mcentee, D. rn.

## 2012-01-18 NOTE — Progress Notes (Signed)
Subjective: I am seeing patient this afternoon at the request to try hospitalization was mistakenly admitted to their service as Dr. Clelia Croft is her primary. I thank them for their good care and covering while in transition. She evidently started with an acute illness as of yesterday while driving out of town consisting of fairly significant nausea vomiting diarrhea and to some extent abdominal pain. It wasn't associated headache and she's had a fairly extensive workup to include lumbar puncture and CT. She's been evaluated as well by critical care medicine. She's been on IV fluids with systolic pressures in the 90s and does appear somewhat toxic last is in step down. She's had no blood in or mucus in her diarrhea. She had no bloody vomitus. She does have a headache an LP is negative. She's had no cough or congestion. She is normally very healthy all her past history includes a hysterectomy.  Objective: Vital signs in last 24 hours: Temp:  [99.4 F (37.4 C)-101.6 F (38.7 C)] 99.4 F (37.4 C) (03/31 0800) Pulse Rate:  [92-120] 92  (03/31 1200) Resp:  [10-20] 16  (03/31 1200) BP: (86-123)/(49-79) 86/57 mmHg (03/31 1200) SpO2:  [94 %-99 %] 98 % (03/31 1200) Weight:  [60.9 kg (134 lb 4.2 oz)] 60.9 kg (134 lb 4.2 oz) (03/31 0515) Weight change:   CBG (last 3)   Basename 01/18/12 0810  GLUCAP 96    Intake/Output from previous day: 03/30 0701 - 03/31 0700 In: 1095 [P.O.:120; I.V.:125; IV Piggyback:800] Out: 300 [Urine:300]  Physical Exam: Patient is awake alert cooperative does appear somewhat toxic. Lungs are clear to auscultation percussion. Cardiovascular same regular rate and rhythm no murmur gallop rub heave. Neck is supple nontender no JVD or bruits. No meningismus. Eyes are normal with no icterus. Abdomen is soft nontender good bowel sounds. No rebound or guarding. Extremities without cyanosis clubbing or edema intact distal pulses no mottling. Neurologic exam is normal.   Lab  Results:  [facility] 01/18/12 0728  NA 142  K 3.3*  CL 112  CO2 21  GLUCOSE 115*  BUN 17  CREATININE 0.56  CALCIUM 6.9*  MG --  PHOS --    Basename 01/18/12 0728  AST 14  ALT 13  ALKPHOS 48  BILITOT 0.3  PROT 5.1*  ALBUMIN 2.8*    Basename 01/18/12 0728  WBC 8.6  NEUTROABS --  HGB 11.8*  HCT 35.6*  MCV 87.9  PLT 200   No results found for this basename: INR, PROTIME   No results found for this basename: CKTOTAL:3,CKMB:3,CKMBINDEX:3,TROPONINI:3 in the last 72 hours No results found for this basename: TSH,T4TOTAL,FREET3,T3FREE,THYROIDAB in the last 72 hours No results found for this basename: VITAMINB12:2,FOLATE:2,FERRITIN:2,TIBC:2,IRON:2,RETICCTPCT:2 in the last 72 hours  Studies/Results: No results found.   Assessment/Plan: #1 nausea vomiting diarrhea or sepsis-like syndrome at this point seems to be viral most likely [place]walk virus that will followup for CTS patient is currently drinking contrast of aspiration to call the results. We'll continue crystalloid support and symptomatic management. He'll for medical has assumed primary care.   LOS: 0 days   Alexandros Ewan A 01/18/2012, 2:51 PM

## 2012-01-19 ENCOUNTER — Inpatient Hospital Stay (HOSPITAL_COMMUNITY): Payer: 59

## 2012-01-19 DIAGNOSIS — R112 Nausea with vomiting, unspecified: Secondary | ICD-10-CM

## 2012-01-19 DIAGNOSIS — R109 Unspecified abdominal pain: Secondary | ICD-10-CM

## 2012-01-19 DIAGNOSIS — K8020 Calculus of gallbladder without cholecystitis without obstruction: Secondary | ICD-10-CM

## 2012-01-19 LAB — CBC
HCT: 35.9 % — ABNORMAL LOW (ref 36.0–46.0)
Hemoglobin: 11.8 g/dL — ABNORMAL LOW (ref 12.0–15.0)
MCH: 28.8 pg (ref 26.0–34.0)
MCHC: 32.9 g/dL (ref 30.0–36.0)
MCV: 87.6 fL (ref 78.0–100.0)
Platelets: 185 10*3/uL (ref 150–400)
RBC: 4.1 MIL/uL (ref 3.87–5.11)
RDW: 13 % (ref 11.5–15.5)
WBC: 4.5 10*3/uL (ref 4.0–10.5)

## 2012-01-19 LAB — URINE CULTURE
Colony Count: NO GROWTH
Culture  Setup Time: 201303311135
Culture: NO GROWTH

## 2012-01-19 LAB — GLUCOSE, CAPILLARY: Glucose-Capillary: 119 mg/dL — ABNORMAL HIGH (ref 70–99)

## 2012-01-19 LAB — BASIC METABOLIC PANEL
BUN: 7 mg/dL (ref 6–23)
CO2: 23 mEq/L (ref 19–32)
Calcium: 7.7 mg/dL — ABNORMAL LOW (ref 8.4–10.5)
Chloride: 110 mEq/L (ref 96–112)
Creatinine, Ser: 0.56 mg/dL (ref 0.50–1.10)
GFR calc Af Amer: >90 mL/min (ref >90)
GFR calc non Af Amer: >90 mL/min (ref >90)
Glucose, Bld: 148 mg/dL — ABNORMAL HIGH (ref 70–99)
Potassium: 3.2 mEq/L — ABNORMAL LOW (ref 3.5–5.1)
Sodium: 140 mEq/L (ref 135–145)

## 2012-01-19 MED ORDER — HYDROMORPHONE HCL PF 1 MG/ML IJ SOLN
0.5000 mg | INTRAMUSCULAR | Status: DC | PRN
  Administered 2012-01-19 – 2012-01-21 (×4): 1 mg via INTRAVENOUS
  Administered 2012-01-21: 0.5 mg via INTRAVENOUS
  Filled 2012-01-19 (×6): qty 1

## 2012-01-19 MED ORDER — HYDROMORPHONE HCL PF 1 MG/ML IJ SOLN
1.0000 mg | Freq: Once | INTRAMUSCULAR | Status: AC
  Administered 2012-01-19: 1 mg via INTRAVENOUS
  Filled 2012-01-19: qty 1

## 2012-01-19 MED ORDER — SPIRONOLACTONE 50 MG PO TABS
50.0000 mg | ORAL_TABLET | Freq: Once | ORAL | Status: AC
  Administered 2012-01-19: 50 mg via ORAL
  Filled 2012-01-19: qty 1

## 2012-01-19 MED ORDER — POTASSIUM CHLORIDE 10 MEQ/100ML IV SOLN
10.0000 meq | INTRAVENOUS | Status: AC
  Administered 2012-01-19 (×4): 10 meq via INTRAVENOUS
  Filled 2012-01-19: qty 100
  Filled 2012-01-19: qty 300

## 2012-01-19 MED ORDER — PANTOPRAZOLE SODIUM 40 MG IV SOLR
40.0000 mg | Freq: Every day | INTRAVENOUS | Status: DC
  Administered 2012-01-19 – 2012-01-21 (×3): 40 mg via INTRAVENOUS
  Filled 2012-01-19 (×4): qty 40

## 2012-01-19 MED ORDER — POTASSIUM CHLORIDE IN NACL 40-0.9 MEQ/L-% IV SOLN
INTRAVENOUS | Status: DC
  Administered 2012-01-19 – 2012-01-20 (×2): via INTRAVENOUS
  Filled 2012-01-19 (×6): qty 1000

## 2012-01-19 NOTE — Progress Notes (Signed)
eLink Physician-Brief Progress Note Patient Name: VENIE MONTESINOS DOB: 1960-12-09 MRN: [id number]  Date of Service  01/19/2012   HPI/Events of Note     eICU Interventions  Hypokalemia, repleted    Intervention Category Intermediate Interventions: Electrolyte abnormality - evaluation and management  Samah Lapiana 01/19/2012, 5:51 AM

## 2012-01-19 NOTE — Consult Note (Signed)
Reason for Consult:Gallstones Referring Physician: Rodrigo Ran, MD  Megan Martin is an 51 y.o. female.  HPI:  Pt is a 51 year old female who presents with 2 days of nausea, vomiting, diarrhea, and abdominal pain. 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 She was driving out of town, and on the way back she started feeling poorly.  She had some water. After drinking that she had profuse projectile vomiting and very shortly after severe diarrhea. There were multiple episodes of this vomiting and diarrhea. After being sick around 10 times she developed a headache and neck pain. She was seen in the emergency department. LP ruled out meningitis. At first, she had diffuse generalized abdominal pain, but now this seems to be in the upper abdomen. A CT scan was questionable for cholecystitis an ultrasound demonstrated gallstones. The ultrasound was negative for cholecystitis. We are asked to comment on the role of gallstones and her pain. She had a first-degree relative that required cholecystectomy for gallbladder disease.  Past Medical History  Diagnosis Date  . Anemia   . Depression   . Neuromuscular disorder     Pt denies any history of this at all   . Chronic kidney disease     Unclear where this diagnosis came from   . Insomnia   . S/P partial hysterectomy     Past Surgical History  Procedure Date  . Cesarean section 1982, 1984  . Laparoscopic hysterectomy     still has cervix, ovaries, 1 tube  . Tonsillectomy   . Myomectomy   . Wisdom tooth extraction     Family History  Problem Relation Age of Onset  . Heart disease Mother   . Hypertension Mother   . Heart disease Father   . Early death Father   . Heart disease Sister   . Hypertension Brother   . Hyperlipidemia Brother     Social History:  reports that she has never smoked. She does not have any smokeless tobacco history on file. She reports that she does not drink alcohol or use illicit  drugs.  Allergies:  Allergies  Allergen Reactions  . Demerol Other (See Comments)    Unknown reaction    Medications:  Prior to Admission:  Prescriptions prior to admission  Medication Sig Dispense Refill  . buPROPion (WELLBUTRIN XL) 150 MG 24 hr tablet Take 150 mg by mouth daily.        . calcium-vitamin D (OSCAL WITH D) 500-200 MG-UNIT per tablet Take 1 tablet by mouth daily.      . cholecalciferol (VITAMIN D) 1000 UNITS tablet Take 1,000 Units by mouth daily.      . clonazePAM (KLONOPIN) 0.5 MG tablet Take 0.5 mg by mouth at bedtime.      Marland Kitchen estradiol (VIVELLE-DOT) 0.1 MG/24HR Place 1 patch onto the skin 2 (two) times a week.        . Multiple Vitamin (MULITIVITAMIN WITH MINERALS) TABS Take 1 tablet by mouth daily.      Marland Kitchen PRISTIQ 50 MG 24 hr tablet TAKE 1 TABLET BY MOUTH DAILY  30 tablet  3  . tretinoin (RETIN-A) 0.025 % cream Apply 1 application topically at bedtime as needed. Apply to affected area        Results for orders placed during the hospital encounter of 01/18/12 (from the past 48 hour(s))  MRSA PCR SCREENING     Status: Normal   Collection Time   01/18/12  5:06 AM  Component Value Range Comment   MRSA by PCR NEGATIVE  NEGATIVE    URINALYSIS, ROUTINE W REFLEX MICROSCOPIC     Status: Abnormal   Collection Time   01/18/12  5:15 AM      Component Value Range Comment   Color, Urine YELLOW  YELLOW     APPearance CLEAR  CLEAR     Specific Gravity, Urine 1.024  1.005 - 1.030     pH 5.0  5.0 - 8.0     Glucose, UA NEGATIVE  NEGATIVE (mg/dL)    Hgb urine dipstick NEGATIVE  NEGATIVE     Bilirubin Urine NEGATIVE  NEGATIVE     Ketones, ur 15 (*) NEGATIVE (mg/dL)    Protein, ur NEGATIVE  NEGATIVE (mg/dL)    Urobilinogen, UA 0.2  0.0 - 1.0 (mg/dL)    Nitrite NEGATIVE  NEGATIVE     Leukocytes, UA NEGATIVE  NEGATIVE  MICROSCOPIC NOT DONE ON URINES WITH NEGATIVE PROTEIN, BLOOD, LEUKOCYTES, NITRITE, OR GLUCOSE <1000 mg/dL.  URINE CULTURE     Status: Normal   Collection Time    01/18/12  5:15 AM      Component Value Range Comment   Specimen Description URINE, CATHETERIZED      Special Requests NONE      Culture  Setup Time [id number]      Colony Count NO GROWTH      Culture NO GROWTH      Report Status 01/19/2012 FINAL     COMPREHENSIVE METABOLIC PANEL     Status: Abnormal   Collection Time   01/18/12  7:28 AM      Component Value Range Comment   Sodium 142  135 - 145 (mEq/L)    Potassium 3.3 (*) 3.5 - 5.1 (mEq/L)    Chloride 112  96 - 112 (mEq/L)    CO2 21  19 - 32 (mEq/L)    Glucose, Bld 115 (*) 70 - 99 (mg/dL)    BUN 17  6 - 23 (mg/dL)    Creatinine, Ser 5.78  0.50 - 1.10 (mg/dL)    Calcium 6.9 (*) 8.4 - 10.5 (mg/dL)    Total Protein 5.1 (*) 6.0 - 8.3 (g/dL)    Albumin 2.8 (*) 3.5 - 5.2 (g/dL)    AST 14  0 - 37 (U/L)    ALT 13  0 - 35 (U/L)    Alkaline Phosphatase 48  39 - 117 (U/L)    Total Bilirubin 0.3  0.3 - 1.2 (mg/dL)    GFR calc non Af Amer >90  >90 (mL/min)    GFR calc Af Amer >90  >90 (mL/min)   CBC     Status: Abnormal   Collection Time   01/18/12  7:28 AM      Component Value Range Comment   WBC 8.6  4.0 - 10.5 (K/uL)    RBC 4.05  3.87 - 5.11 (MIL/uL)    Hemoglobin 11.8 (*) 12.0 - 15.0 (g/dL)    HCT 46.9 (*) 62.9 - 46.0 (%)    MCV 87.9  78.0 - 100.0 (fL)    MCH 29.1  26.0 - 34.0 (pg)    MCHC 33.1  30.0 - 36.0 (g/dL)    RDW 52.8  41.3 - 24.4 (%)    Platelets 200  150 - 400 (K/uL)   CULTURE, BLOOD (SINGLE)     Status: Normal (Preliminary result)   Collection Time   01/18/12  7:28 AM      Component Value Range Comment  Specimen Description BLOOD RIGHT ARM      Special Requests BOTTLES DRAWN AEROBIC AND ANAEROBIC 10CC      Culture  Setup Time [id number]      Culture        Value:        BLOOD CULTURE RECEIVED NO GROWTH TO DATE CULTURE WILL BE HELD FOR 5 DAYS BEFORE ISSUING A FINAL NEGATIVE REPORT   Report Status PENDING     LACTIC ACID, PLASMA     Status: Normal   Collection Time   01/18/12  7:28 AM      Component Value  Range Comment   Lactic Acid, Venous 1.0  0.5 - 2.2 (mmol/L)   LIPASE, BLOOD     Status: Normal   Collection Time   01/18/12  7:28 AM      Component Value Range Comment   Lipase 13  11 - 59 (U/L)   PROCALCITONIN     Status: Normal   Collection Time   01/18/12  7:30 AM      Component Value Range Comment   Procalcitonin 0.34     GLUCOSE, CAPILLARY     Status: Normal   Collection Time   01/18/12  8:10 AM      Component Value Range Comment   Glucose-Capillary 96  70 - 99 (mg/dL)    Comment 1 Documented in Chart      Comment 2 Notify RN     CBC     Status: Abnormal   Collection Time   01/19/12  4:05 AM      Component Value Range Comment   WBC 4.5  4.0 - 10.5 (K/uL)    RBC 4.10  3.87 - 5.11 (MIL/uL)    Hemoglobin 11.8 (*) 12.0 - 15.0 (g/dL)    HCT 81.1 (*) 91.4 - 46.0 (%)    MCV 87.6  78.0 - 100.0 (fL)    MCH 28.8  26.0 - 34.0 (pg)    MCHC 32.9  30.0 - 36.0 (g/dL)    RDW 78.2  95.6 - 21.3 (%)    Platelets 185  150 - 400 (K/uL)   BASIC METABOLIC PANEL     Status: Abnormal   Collection Time   01/19/12  4:05 AM      Component Value Range Comment   Sodium 140  135 - 145 (mEq/L)    Potassium 3.2 (*) 3.5 - 5.1 (mEq/L)    Chloride 110  96 - 112 (mEq/L)    CO2 23  19 - 32 (mEq/L)    Glucose, Bld 148 (*) 70 - 99 (mg/dL)    BUN 7  6 - 23 (mg/dL)    Creatinine, Ser 0.86  0.50 - 1.10 (mg/dL)    Calcium 7.7 (*) 8.4 - 10.5 (mg/dL)    GFR calc non Af Amer >90  >90 (mL/min)    GFR calc Af Amer >90  >90 (mL/min)   GLUCOSE, CAPILLARY     Status: Abnormal   Collection Time   01/19/12  8:37 AM      Component Value Range Comment   Glucose-Capillary 119 (*) 70 - 99 (mg/dL)    Comment 1 Notify RN       US Abdomen Complete  01/19/2012  *RADIOLOGY REPORT*  Clinical Data:  Cholecystitis  ABDOMINAL ULTRASOUND COMPLETE  Comparison:  CT 01/18/2012  Findings:  Gallbladder:  Small gallstones are present.  Upper normal wall thickness.  No evidence of gallbladder distention.  Small amount of pericholecystic  fluid. Negative sonographic  Murphy's sign.  Common Bile Duct:  Within normal limits in caliber.  Liver: Hyper echoic lesions are noted corresponding to the lesions seen on CT.  A complex cystic lesion with septations in the anterior right lobe is not significantly changed compared to the CT.  IVC:  Appears normal.  Pancreas:  No abnormality identified.  Spleen:  Within normal limits in size and echotexture.  Right kidney:  Normal in size and parenchymal echogenicity.  No evidence of mass or hydronephrosis.  Left kidney:  Normal in size and parenchymal echogenicity.  No evidence of mass or hydronephrosis.  Abdominal Aorta:  No aneurysm identified.  IMPRESSION: Stable liver lesions.  Cholelithiasis.  Small amount of pericholecystic fluid.  No evidence of gallbladder distention.  Original Report Authenticated By: Donavan Burnet, M.D.   Ct Abdomen Pelvis W Contrast  01/18/2012  *RADIOLOGY REPORT*  Clinical Data: Sepsis.  Nausea, vomiting and abdominal pain.  CT ABDOMEN AND PELVIS WITH CONTRAST  Technique:  Multidetector CT imaging of the abdomen and pelvis was performed following the standard protocol during bolus administration of intravenous contrast.  Contrast: OMNIPAQUE IOHEXOL 300 MG/ML IJ SOLN  Comparison: None.  Findings: The lung bases are clear except for minimal dependent bibasilar atelectasis.  The multiple hepatic lesions are identified.  Two of these measure as simple cysts.  The other three lesions are most likely benign hepatic hemangiomas. MRI without and with contrast is recommended for further evaluation ( non urgent). No biliary dilatation.  The gallbladder is mildly distended.  Mild wall thickening/edema is suspected.  The common bile duct is normal in caliber. Cannot exclude small common bile duct stones.  The pancreas appears normal.  The spleen is normal in size.  No focal lesions.  The adrenal glands and kidneys are normal.  The stomach is not well distended with contrast but no gross  abnormalities are identified.  The duodenum, small bowel and colon demonstrate no significant abnormalities.  No acute inflammatory findings or mass lesions.  No mesenteric or retroperitoneal masses or lymphadenopathy.  Small scattered lymph nodes are noted.  The aorta is normal in caliber.  The major branch vessels are normal.  The uterus and ovaries are unremarkable.  The bladder is decompressed by a Foley catheter.  A small amount of free pelvic fluid is noted.  No inguinal mass or hernia.  The bony structures are intact.  IMPRESSION:  1.  Mildly distended gallbladder with wall thickening/edema suspicious for cholecystitis.  The common bile duct is normal in caliber but cannot exclude small common bile duct stones. Ultrasound is suggested for further evaluation. 2.  Multiple liver lesions, likely benign but recommend follow-up MRI examination without and with contrast for further evaluation.  Original Report Authenticated By: P. Loralie Champagne, M.D.    Review of Systems  Constitutional: Positive for fever, chills, malaise/fatigue and diaphoresis.  HENT: Positive for neck pain (after profuse vomiting). Negative for sore throat.   Eyes: Negative.   Respiratory: Negative.   Cardiovascular: Negative.   Gastrointestinal: Positive for nausea, vomiting, abdominal pain and diarrhea.  Genitourinary: Negative.   Skin: Negative.   Neurological: Positive for weakness and headaches (after significant vomiting.).  Endo/Heme/Allergies: Negative.   Psychiatric/Behavioral: Negative.    Blood pressure 109/63, pulse 76, temperature 98.6 F (37 C), temperature source Oral, resp. rate 15, height 5\' 2"  (1.575 m), weight 143 lb 11.8 oz (65.2 kg), SpO2 97.00%. Physical Exam  Constitutional: She is oriented to person, place, and time. She appears well-developed and well-nourished.  No distress.  HENT:  Head: Normocephalic and atraumatic.  Right Ear: External ear normal.  Left Ear: External ear normal.  Eyes:  Conjunctivae are normal. Pupils are equal, round, and reactive to light. No scleral icterus.  Neck: Normal range of motion. Neck supple. No tracheal deviation present. No thyromegaly present.  Cardiovascular: Normal rate, regular rhythm and intact distal pulses.  Exam reveals no gallop and no friction rub.   No murmur heard. Respiratory: Effort normal and breath sounds normal. No respiratory distress. She has no wheezes. She has no rales. She exhibits no tenderness.  GI: Soft. Bowel sounds are normal. She exhibits no distension and no mass. There is tenderness (mild diffuse tenderness). There is no rebound and no guarding.  Musculoskeletal: Normal range of motion. She exhibits no edema and no tenderness.  Lymphadenopathy:    She has no cervical adenopathy.  Neurological: She is alert and oriented to person, place, and time. Coordination normal.  Skin: Skin is warm and dry. No rash noted. She is not diaphoretic. No erythema. No pallor.  Psychiatric: She has a normal mood and affect. Her behavior is normal. Judgment and thought content normal.    Assessment/Plan: Gallstones  Do not think that cholelithiasis is cause of this woman's symptoms.  It is exceedingly unlikely that cholelithiasis caused sudden onset of projectile vomiting and watery diarrhea in association with fever.  She certainly has cholelithiasis and upper abdominal pain at this point. If these symptoms continue to evolve, she may need a HIDA scan to rule out acute cholecystitis. We'll continue to follow. Her symptoms are most consistent with acute gastroenteritis. Michah Minton 01/19/2012, 8:20 PM

## 2012-01-19 NOTE — Progress Notes (Signed)
Subjective: Had sharp epigastric and substernal chest pain with nausea earlier today. Currently feels better without pain and with continued mild nausea and decreased appetite. I appreciate the evaluation today by general surgeon regarding pain.  Objective: Vital signs in last 24 hours: Temp:  [97.1 F (36.2 C)-100.6 F (38.1 C)] 98.6 F (37 C) (04/01 1204) Pulse Rate:  [69-95] 76  (04/01 1204) Resp:  [13-25] 15  (04/01 1204) BP: (98-113)/(50-67) 109/63 mmHg (04/01 1204) SpO2:  [94 %-100 %] 97 % (04/01 1204) Weight:  [65.2 kg (143 lb 11.8 oz)] 65.2 kg (143 lb 11.8 oz) (03/31 2351) Weight change: 4.3 kg (9 lb 7.7 oz)   Intake/Output from previous day: 03/31 0701 - 04/01 0700 In: 3105 [P.O.:480; I.V.:1725; IV Piggyback:650] Out: 1800 [Urine:1800]   General appearance: alert, cooperative and no distress Resp: clear to auscultation bilaterally Cardio: regular rate and rhythm, S1, S2 normal, no murmur, click, rub or gallop GI: soft, non-tender; bowel sounds normal; no masses,  no organomegaly Extremities: extremities normal, atraumatic, no cyanosis or edema  Lab Results:  Basename 01/19/12 0405 01/18/12 0728  WBC 4.5 8.6  HGB 11.8* 11.8*  HCT 35.9* 35.6*  PLT 185 200   BMET  Basename 01/19/12 0405 01/18/12 0728  NA 140 142  K 3.2* 3.3*  CL 110 112  CO2 23 21  GLUCOSE 148* 115*  BUN 7 17  CREATININE 0.56 0.56  CALCIUM 7.7* 6.9*   CMET CMP     Component Value Date/Time   NA 140 01/19/2012 0405   K 3.2* 01/19/2012 0405   CL 110 01/19/2012 0405   CO2 23 01/19/2012 0405   GLUCOSE 148* 01/19/2012 0405   BUN 7 01/19/2012 0405   CREATININE 0.56 01/19/2012 0405   CALCIUM 7.7* 01/19/2012 0405   PROT 5.1* 01/18/2012 0728   ALBUMIN 2.8* 01/18/2012 0728   AST 14 01/18/2012 0728   ALT 13 01/18/2012 0728   ALKPHOS 48 01/18/2012 0728   BILITOT 0.3 01/18/2012 0728   GFRNONAA >90 01/19/2012 0405   GFRAA >90 01/19/2012 0405    CBG (last 3)   Basename 01/19/12 0837 01/18/12 0810  GLUCAP 119*  96    INR RESULTS:   No results found for this basename: INR, PROTIME     Studies/Results: US Abdomen Complete  01/19/2012  *RADIOLOGY REPORT*  Clinical Data:  Cholecystitis  ABDOMINAL ULTRASOUND COMPLETE  Comparison:  CT 01/18/2012  Findings:  Gallbladder:  Small gallstones are present.  Upper normal wall thickness.  No evidence of gallbladder distention.  Small amount of pericholecystic fluid. Negative sonographic Murphy's sign.  Common Bile Duct:  Within normal limits in caliber.  Liver: Hyper echoic lesions are noted corresponding to the lesions seen on CT.  A complex cystic lesion with septations in the anterior right lobe is not significantly changed compared to the CT.  IVC:  Appears normal.  Pancreas:  No abnormality identified.  Spleen:  Within normal limits in size and echotexture.  Right kidney:  Normal in size and parenchymal echogenicity.  No evidence of mass or hydronephrosis.  Left kidney:  Normal in size and parenchymal echogenicity.  No evidence of mass or hydronephrosis.  Abdominal Aorta:  No aneurysm identified.  IMPRESSION: Stable liver lesions.  Cholelithiasis.  Small amount of pericholecystic fluid.  No evidence of gallbladder distention.  Original Report Authenticated By: Donavan Burnet, M.D.   Ct Abdomen Pelvis W Contrast  01/18/2012  *RADIOLOGY REPORT*  Clinical Data: Sepsis.  Nausea, vomiting and abdominal pain.  CT  ABDOMEN AND PELVIS WITH CONTRAST  Technique:  Multidetector CT imaging of the abdomen and pelvis was performed following the standard protocol during bolus administration of intravenous contrast.  Contrast: OMNIPAQUE IOHEXOL 300 MG/ML IJ SOLN  Comparison: None.  Findings: The lung bases are clear except for minimal dependent bibasilar atelectasis.  The multiple hepatic lesions are identified.  Two of these measure as simple cysts.  The other three lesions are most likely benign hepatic hemangiomas. MRI without and with contrast is recommended for further  evaluation ( non urgent). No biliary dilatation.  The gallbladder is mildly distended.  Mild wall thickening/edema is suspected.  The common bile duct is normal in caliber. Cannot exclude small common bile duct stones.  The pancreas appears normal.  The spleen is normal in size.  No focal lesions.  The adrenal glands and kidneys are normal.  The stomach is not well distended with contrast but no gross abnormalities are identified.  The duodenum, small bowel and colon demonstrate no significant abnormalities.  No acute inflammatory findings or mass lesions.  No mesenteric or retroperitoneal masses or lymphadenopathy.  Small scattered lymph nodes are noted.  The aorta is normal in caliber.  The major branch vessels are normal.  The uterus and ovaries are unremarkable.  The bladder is decompressed by a Foley catheter.  A small amount of free pelvic fluid is noted.  No inguinal mass or hernia.  The bony structures are intact.  IMPRESSION:  1.  Mildly distended gallbladder with wall thickening/edema suspicious for cholecystitis.  The common bile duct is normal in caliber but cannot exclude small common bile duct stones. Ultrasound is suggested for further evaluation. 2.  Multiple liver lesions, likely benign but recommend follow-up MRI examination without and with contrast for further evaluation.  Original Report Authenticated By: P. Loralie Champagne, M.D.    Medications: I have reviewed the patient's current medications.  Assessment/Plan: #1 Nausea:  Most likely from norvirius infection and less likely from acute cholecystitis given US findings with a negative sonographic Murphy's sign. We will continue current meds, and if sxs worsen will get a HIDA scan. Will change to full liquids diet in the morning. #2 Hypokalemia: mild and will be corrected with po and IV meds.  LOS: 1 day   Jessey Huyett G 01/19/2012, 7:37 PM

## 2012-01-19 NOTE — Progress Notes (Signed)
UR Completed.  Marsha Hillman Jane [phone] 01/19/2012  

## 2012-01-19 NOTE — Progress Notes (Signed)
Patient is concerned that MD has not rounded on her today. I called Dr. Alver Fisher office. Dr. Clelia Croft is out of town this week and Dr. Cleora Fleet is covering. Spoke with Marchelle Folks and she states that a MD will be rounding but does not know which MD because the MD's are in rooms with patients right now.

## 2012-01-20 ENCOUNTER — Encounter (HOSPITAL_COMMUNITY): Payer: Self-pay

## 2012-01-20 ENCOUNTER — Inpatient Hospital Stay (HOSPITAL_COMMUNITY): Payer: 59

## 2012-01-20 DIAGNOSIS — A088 Other specified intestinal infections: Secondary | ICD-10-CM

## 2012-01-20 DIAGNOSIS — F411 Generalized anxiety disorder: Secondary | ICD-10-CM

## 2012-01-20 DIAGNOSIS — E876 Hypokalemia: Secondary | ICD-10-CM

## 2012-01-20 DIAGNOSIS — J84114 Acute interstitial pneumonitis: Secondary | ICD-10-CM

## 2012-01-20 LAB — CBC
HCT: 33 % — ABNORMAL LOW (ref 36.0–46.0)
Hemoglobin: 11 g/dL — ABNORMAL LOW (ref 12.0–15.0)
MCH: 29.4 pg (ref 26.0–34.0)
MCHC: 33.3 g/dL (ref 30.0–36.0)
MCV: 88.2 fL (ref 78.0–100.0)
Platelets: 201 10*3/uL (ref 150–400)
RBC: 3.74 MIL/uL — ABNORMAL LOW (ref 3.87–5.11)
RDW: 13.1 % (ref 11.5–15.5)
WBC: 7.4 10*3/uL (ref 4.0–10.5)

## 2012-01-20 LAB — GLUCOSE, CAPILLARY: Glucose-Capillary: 118 mg/dL — ABNORMAL HIGH (ref 70–99)

## 2012-01-20 LAB — BASIC METABOLIC PANEL
BUN: 6 mg/dL (ref 6–23)
CO2: 23 mEq/L (ref 19–32)
Calcium: 7.7 mg/dL — ABNORMAL LOW (ref 8.4–10.5)
Chloride: 110 mEq/L (ref 96–112)
Creatinine, Ser: 0.64 mg/dL (ref 0.50–1.10)
GFR calc Af Amer: >90 mL/min (ref >90)
GFR calc non Af Amer: >90 mL/min (ref >90)
Glucose, Bld: 93 mg/dL (ref 70–99)
Potassium: 3.3 mEq/L — ABNORMAL LOW (ref 3.5–5.1)
Sodium: 140 mEq/L (ref 135–145)

## 2012-01-20 MED ORDER — PIPERACILLIN-TAZOBACTAM 3.375 G IVPB
3.3750 g | Freq: Three times a day (TID) | INTRAVENOUS | Status: DC
  Administered 2012-01-20 – 2012-01-21 (×5): 3.375 g via INTRAVENOUS
  Filled 2012-01-20 (×7): qty 50

## 2012-01-20 MED ORDER — METHYLPREDNISOLONE SODIUM SUCC 125 MG IJ SOLR
80.0000 mg | Freq: Once | INTRAMUSCULAR | Status: AC
  Administered 2012-01-20: 80 mg via INTRAVENOUS
  Filled 2012-01-20: qty 2

## 2012-01-20 MED ORDER — LEVALBUTEROL HCL 0.63 MG/3ML IN NEBU
0.6300 mg | INHALATION_SOLUTION | RESPIRATORY_TRACT | Status: DC | PRN
  Administered 2012-01-20: 0.63 mg via RESPIRATORY_TRACT
  Filled 2012-01-20 (×2): qty 3

## 2012-01-20 MED ORDER — LEVALBUTEROL HCL 0.63 MG/3ML IN NEBU
0.6300 mg | INHALATION_SOLUTION | Freq: Once | RESPIRATORY_TRACT | Status: AC
  Administered 2012-01-20: 0.63 mg via RESPIRATORY_TRACT

## 2012-01-20 MED ORDER — METHYLPREDNISOLONE SODIUM SUCC 125 MG IJ SOLR
INTRAMUSCULAR | Status: AC
  Filled 2012-01-20: qty 2

## 2012-01-20 MED ORDER — SPIRONOLACTONE 50 MG PO TABS
50.0000 mg | ORAL_TABLET | Freq: Once | ORAL | Status: AC
  Administered 2012-01-20: 50 mg via ORAL
  Filled 2012-01-20: qty 1

## 2012-01-20 MED ORDER — LORAZEPAM 2 MG/ML IJ SOLN
1.0000 mg | INTRAMUSCULAR | Status: DC | PRN
  Administered 2012-01-20 – 2012-01-21 (×4): 1 mg via INTRAVENOUS
  Filled 2012-01-20 (×5): qty 1

## 2012-01-20 MED ORDER — POTASSIUM CHLORIDE 10 MEQ/100ML IV SOLN
10.0000 meq | INTRAVENOUS | Status: AC
  Administered 2012-01-20: 10 meq via INTRAVENOUS
  Filled 2012-01-20: qty 300

## 2012-01-20 MED ORDER — FUROSEMIDE 10 MG/ML IJ SOLN
40.0000 mg | Freq: Once | INTRAMUSCULAR | Status: AC
  Administered 2012-01-20: 40 mg via INTRAVENOUS
  Filled 2012-01-20: qty 4

## 2012-01-20 MED ORDER — POTASSIUM CHLORIDE 10 MEQ/100ML IV SOLN
10.0000 meq | INTRAVENOUS | Status: AC
  Administered 2012-01-20 – 2012-01-21 (×3): 10 meq via INTRAVENOUS

## 2012-01-20 MED ORDER — HYDROCOD POLST-CHLORPHEN POLST 10-8 MG/5ML PO LQCR
5.0000 mL | Freq: Two times a day (BID) | ORAL | Status: DC | PRN
  Administered 2012-01-20: 5 mL via ORAL
  Filled 2012-01-20: qty 5

## 2012-01-20 NOTE — Progress Notes (Signed)
Pt. woke with bouts persistent coughing spells, dry cough, noted some blood tinged in the saliva, breath sound  bilaterally with tight wheezing, o2 sats. was maintained to  High 90's. HR was 120's-130's tachycardic. Breathing labored RR-30's, pt. wants to call  critical care MD , Dr. Marveen Reeks was already notified of pts. s ymptoms and also Dr. Kendrick Fries on E-link was able to camera in the pt. orders were made. Chest x-ray was done, xeponex breathing tx. was given by respiratory and tussionex po given. Pt. Felt better for a short time and when a friend arrive in the room pt. Had episode of coughing spells unable to talk and complained of hard to breath by writing.. Dr. Kendrick Fries was notified again, and will send Critical care on call MD. Dr. Marveen Reeks was also called and updated of pts. status with some more orders. Dr. Marin Shutter in and assessed pt. with orders made. Lasix 40mg .IV given and solumedrol 80mg . IV x1 given. Ativan 1mg . IV given as well. Will cont. to monitor.

## 2012-01-20 NOTE — Progress Notes (Signed)
eLink Physician-Brief Progress Note Patient Name: Megan Martin DOB: 12-28-1960 MRN: [id number]  Date of Service  01/20/2012   HPI/Events of Note   Notified by RN that Megan Martin was dyspnic; camera in to room shows that she is able to speak in full sentences, O2 saturation 100%; notably very anxious  eICU Interventions    Xopenex and CXR ordered by primary service, I agree with both Reassured patient and nurse;  Nurse to let us know if no improvement with Xopenex; Nurse to call primary service with CXR   Intervention Category Intermediate Interventions: Respiratory distress - evaluation and management Minor Interventions: Agitation / anxiety - evaluation and management  Areebah Meinders 01/20/2012, 4:46 AM

## 2012-01-20 NOTE — Progress Notes (Signed)
Asked to see Megan Martin due to concern for possible allergic reaction / breathing difficulty.  RN describes onset of symptoms one hour ago when patient woke up from sleep.  There was noisy breathing and forceful coughing.  No new medications were administered prior to onset of symptoms. Now she describes some burning in her chest.  She states she did not sleep much last night (about 2 hours) and not at all the night before.  Temp:  [97.9 F (36.6 C)-98.9 F (37.2 C)] 97.9 F (36.6 C) (04/02 0111) Pulse Rate:  [72-126] 125  (04/02 0500) Resp:  [13-30] 25  (04/02 0500) BP: (99-127)/(55-97) 127/97 mmHg (04/02 0500) SpO2:  [91 %-100 %] 98 % (04/02 0553) Weight:  [68.1 kg (150 lb 2.1 oz)] 68.1 kg (150 lb 2.1 oz) (04/02 0111)  She is sitting up in bed, appears anxious She refused to speak initially, but when calmed down spoke in normal voice without stridor There is no lip or tongue swelling, there is mild periorbital edema Chest sounds clear, with good air movement and no wheezing Hard is rapid but regular There is some epigastric abdominal tenderness without rebound Skin is without erythema or hives  CXR - mild pulmonary vascular congestion vs. interstitial pneumonitis, no overt infiltrates  I do not see any signs af acute respiratory / airway compromise, though there is a possibility of interstitial (?viral) pneumonitis This is not likely to be an anaphylactic reaction There is possible vocal cord dysfunction There is definitely a significant anxiety component  -->  Agree with one round of Lasix, Solu-Medrol,  Xopenex, but probably would not continue -->  Will start Ativan -->  Will give additional K as hypokalemic and giving Lasix -->  Will send respiratory virus panel (includes influenza A/B as well as others) -->  Add respiratory isolation for now  Orlean Bradford, M.D., F.C.C.P. Pulmonary and Critical Care Medicine [facility] Cell: [phone] Pager: [phone]

## 2012-01-20 NOTE — Progress Notes (Signed)
Subjective: Nausea has improved and still feels some bloating, but would like to try a regular diet. Her dyspnea from earlier today has resolved.  Objective: Vital signs in last 24 hours: Temp:  [97.9 F (36.6 C)-101.6 F (38.7 C)] 99.2 F (37.3 C) (04/02 0700) Pulse Rate:  [77-126] 109  (04/02 0700) Resp:  [13-31] 25  (04/02 0700) BP: (99-131)/(55-97) 108/67 mmHg (04/02 0700) SpO2:  [88 %-100 %] 100 % (04/02 0700) Weight:  [68.1 kg (150 lb 2.1 oz)] 68.1 kg (150 lb 2.1 oz) (04/02 0111) Weight change: 2.9 kg (6 lb 6.3 oz)   Intake/Output from previous day: 04/01 0701 - 04/02 0700 In: 1506 [P.O.:240; I.V.:560; IV Piggyback:706] Out: 5000 [Urine:5000]   General appearance: alert, cooperative and no distress Resp: clear to auscultation bilaterally Cardio: regular rate and rhythm, S1, S2 normal, no murmur, click, rub or gallop GI: soft, non-tender; bowel sounds normal; no masses,  no organomegaly Extremities: extremities normal, atraumatic, no cyanosis or edema  Lab Results:  Basename 01/20/12 0410 01/19/12 0405  WBC 7.4 4.5  HGB 11.0* 11.8*  HCT 33.0* 35.9*  PLT 201 185   BMET  Basename 01/20/12 0410 01/19/12 0405  NA 140 140  K 3.3* 3.2*  CL 110 110  CO2 23 23  GLUCOSE 93 148*  BUN 6 7  CREATININE 0.64 0.56  CALCIUM 7.7* 7.7*   CMET CMP     Component Value Date/Time   NA 140 01/20/2012 0410   K 3.3* 01/20/2012 0410   CL 110 01/20/2012 0410   CO2 23 01/20/2012 0410   GLUCOSE 93 01/20/2012 0410   BUN 6 01/20/2012 0410   CREATININE 0.64 01/20/2012 0410   CALCIUM 7.7* 01/20/2012 0410   PROT 5.1* 01/18/2012 0728   ALBUMIN 2.8* 01/18/2012 0728   AST 14 01/18/2012 0728   ALT 13 01/18/2012 0728   ALKPHOS 48 01/18/2012 0728   BILITOT 0.3 01/18/2012 0728   GFRNONAA >90 01/20/2012 0410   GFRAA >90 01/20/2012 0410    CBG (last 3)   Basename 01/20/12 0832 01/19/12 0837 01/18/12 0810  GLUCAP 118* 119* 96    INR RESULTS:   No results found for this basename: INR, PROTIME      Studies/Results: US Abdomen Complete  01/19/2012  *RADIOLOGY REPORT*  Clinical Data:  Cholecystitis  ABDOMINAL ULTRASOUND COMPLETE  Comparison:  CT 01/18/2012  Findings:  Gallbladder:  Small gallstones are present.  Upper normal wall thickness.  No evidence of gallbladder distention.  Small amount of pericholecystic fluid. Negative sonographic Murphy's sign.  Common Bile Duct:  Within normal limits in caliber.  Liver: Hyper echoic lesions are noted corresponding to the lesions seen on CT.  A complex cystic lesion with septations in the anterior right lobe is not significantly changed compared to the CT.  IVC:  Appears normal.  Pancreas:  No abnormality identified.  Spleen:  Within normal limits in size and echotexture.  Right kidney:  Normal in size and parenchymal echogenicity.  No evidence of mass or hydronephrosis.  Left kidney:  Normal in size and parenchymal echogenicity.  No evidence of mass or hydronephrosis.  Abdominal Aorta:  No aneurysm identified.  IMPRESSION: Stable liver lesions.  Cholelithiasis.  Small amount of pericholecystic fluid.  No evidence of gallbladder distention.  Original Report Authenticated By: Donavan Burnet, M.D.   Ct Abdomen Pelvis W Contrast  01/18/2012  *RADIOLOGY REPORT*  Clinical Data: Sepsis.  Nausea, vomiting and abdominal pain.  CT ABDOMEN AND PELVIS WITH CONTRAST  Technique:  Multidetector CT imaging of the abdomen and pelvis was performed following the standard protocol during bolus administration of intravenous contrast.  Contrast: OMNIPAQUE IOHEXOL 300 MG/ML IJ SOLN  Comparison: None.  Findings: The lung bases are clear except for minimal dependent bibasilar atelectasis.  The multiple hepatic lesions are identified.  Two of these measure as simple cysts.  The other three lesions are most likely benign hepatic hemangiomas. MRI without and with contrast is recommended for further evaluation ( non urgent). No biliary dilatation.  The gallbladder is mildly  distended.  Mild wall thickening/edema is suspected.  The common bile duct is normal in caliber. Cannot exclude small common bile duct stones.  The pancreas appears normal.  The spleen is normal in size.  No focal lesions.  The adrenal glands and kidneys are normal.  The stomach is not well distended with contrast but no gross abnormalities are identified.  The duodenum, small bowel and colon demonstrate no significant abnormalities.  No acute inflammatory findings or mass lesions.  No mesenteric or retroperitoneal masses or lymphadenopathy.  Small scattered lymph nodes are noted.  The aorta is normal in caliber.  The major branch vessels are normal.  The uterus and ovaries are unremarkable.  The bladder is decompressed by a Foley catheter.  A small amount of free pelvic fluid is noted.  No inguinal mass or hernia.  The bony structures are intact.  IMPRESSION:  1.  Mildly distended gallbladder with wall thickening/edema suspicious for cholecystitis.  The common bile duct is normal in caliber but cannot exclude small common bile duct stones. Ultrasound is suggested for further evaluation. 2.  Multiple liver lesions, likely benign but recommend follow-up MRI examination without and with contrast for further evaluation.  Original Report Authenticated By: P. Loralie Champagne, M.D.   Dg Chest Port 1 View  01/20/2012  *RADIOLOGY REPORT*  Clinical Data: Shortness of breath.  Cough.  PORTABLE CHEST - 1 VIEW  Comparison: None.  Findings: Borderline heart size with normal pulmonary vascularity. Interstitial changes in the lungs which might represent chronic fibrosis or acute interstitial pneumonia/edema.  No blunting of costophrenic angles.  No pneumothorax.  Bilateral cervical ribs.  IMPRESSION: Interstitial changes in the lungs which might represent interstitial infiltration, edema, or fibrosis.  Original Report Authenticated By: Marlon Pel, M.D.    Medications: I have reviewed the patient's current  medications.  Assessment/Plan: #1 Nausea:  Improved and most likely due to a viral syndrome, and less likely due to cholelithiasis. Will advance her to a regular diet. #2 Dyspnea:  Unclear cause, possibly due to fluid overload. Will give 1 dose of aldactone which should help hypokalemia as well. Appreciate evaluation by CCM consultant.  LOS: 2 days   Carlitos Bottino G 01/20/2012, 12:07 PM

## 2012-01-20 NOTE — Progress Notes (Signed)
Pt. Reassessed, HR and breathing is better according to pt, noted that pt. able to carry conversation with friend at the bedside without short of breath, wheezing decrease and O2 sats. remained stable. Will cont. to monitor.

## 2012-01-20 NOTE — Progress Notes (Signed)
General Surgery Note  LOS: 2 days   Assessment/Plan:  1.  Cholelithiasis -    I agree with Dr. Donell Beers, her presentation is not like typical gall bladder disease.  Vague upper abdominal pain now, vomiting and diarrhea resolved, as of now.     2.  Sepsis - Procalcitonin - 0.34 on 01/18/2012.  On Flagyl/Zosyn 3. Leukocytosis  (on diagnosis in computer)  WBC - 7,400 - 01/20/2012 4.  Headache. 5.  C. Diff pending. 6.  Respiratory trouble this AM.  Cause unknown.  She is better now.  Seen by Dr. Blima Dessert.  Subjective:  Friend in room. She works in [employer].  Objective:   Filed Vitals:   01/20/12 0700  BP:   Pulse: 109  Temp:   Resp: 25     Intake/Output from previous day:  04/01 0701 - 04/02 0700 In: 556 [IV Piggyback:556] Out: 1550 [Urine:1550]  Intake/Output this shift:      Physical Exam:   General: Thin WN WF who is alert and oriented. But still does not feel well.   HEENT: Normal. Pupils equal. Good dentition. .   Lungs: Clear.   Abdomen: Soft, though she say she has epigastric pain.   Neurologic:  Grossly intact to motor and sensory function.   Psychiatric: Has normal mood and affect. Behavior is normal   Lab Results:    Basename 01/20/12 0410 01/19/12 0405  WBC 7.4 4.5  HGB 11.0* 11.8*  HCT 33.0* 35.9*  PLT 201 185    BMET   Basename 01/20/12 0410 01/19/12 0405  NA 140 140  K 3.3* 3.2*  CL 110 110  CO2 23 23  GLUCOSE 93 148*  BUN 6 7  CREATININE 0.64 0.56  CALCIUM 7.7* 7.7*    PT/INR  No results found for this basename: LABPROT:2,INR:2 in the last 72 hours  ABG  No results found for this basename: PHART:2,PCO2:2,PO2:2,HCO3:2 in the last 72 hours   Studies/Results:  US Abdomen Complete  01/19/2012  *RADIOLOGY REPORT*  Clinical Data:  Cholecystitis  ABDOMINAL ULTRASOUND COMPLETE  Comparison:  CT 01/18/2012  Findings:  Gallbladder:  Small gallstones are present.  Upper normal wall thickness.  No evidence of gallbladder distention.  Small  amount of pericholecystic fluid. Negative sonographic Murphy's sign.  Common Bile Duct:  Within normal limits in caliber.  Liver: Hyper echoic lesions are noted corresponding to the lesions seen on CT.  A complex cystic lesion with septations in the anterior right lobe is not significantly changed compared to the CT.  IVC:  Appears normal.  Pancreas:  No abnormality identified.  Spleen:  Within normal limits in size and echotexture.  Right kidney:  Normal in size and parenchymal echogenicity.  No evidence of mass or hydronephrosis.  Left kidney:  Normal in size and parenchymal echogenicity.  No evidence of mass or hydronephrosis.  Abdominal Aorta:  No aneurysm identified.  IMPRESSION: Stable liver lesions.  Cholelithiasis.  Small amount of pericholecystic fluid.  No evidence of gallbladder distention.  Original Report Authenticated By: Donavan Burnet, M.D.   Ct Abdomen Pelvis W Contrast  01/18/2012  *RADIOLOGY REPORT*  Clinical Data: Sepsis.  Nausea, vomiting and abdominal pain.  CT ABDOMEN AND PELVIS WITH CONTRAST  Technique:  Multidetector CT imaging of the abdomen and pelvis was performed following the standard protocol during bolus administration of intravenous contrast.  Contrast: OMNIPAQUE IOHEXOL 300 MG/ML IJ SOLN  Comparison: None.  Findings: The lung bases are clear except for minimal dependent bibasilar atelectasis.  The multiple hepatic lesions are identified.  Two of these measure as simple cysts.  The other three lesions are most likely benign hepatic hemangiomas. MRI without and with contrast is recommended for further evaluation ( non urgent). No biliary dilatation.  The gallbladder is mildly distended.  Mild wall thickening/edema is suspected.  The common bile duct is normal in caliber. Cannot exclude small common bile duct stones.  The pancreas appears normal.  The spleen is normal in size.  No focal lesions.  The adrenal glands and kidneys are normal.  The stomach is not well distended  with contrast but no gross abnormalities are identified.  The duodenum, small bowel and colon demonstrate no significant abnormalities.  No acute inflammatory findings or mass lesions.  No mesenteric or retroperitoneal masses or lymphadenopathy.  Small scattered lymph nodes are noted.  The aorta is normal in caliber.  The major branch vessels are normal.  The uterus and ovaries are unremarkable.  The bladder is decompressed by a Foley catheter.  A small amount of free pelvic fluid is noted.  No inguinal mass or hernia.  The bony structures are intact.  IMPRESSION:  1.  Mildly distended gallbladder with wall thickening/edema suspicious for cholecystitis.  The common bile duct is normal in caliber but cannot exclude small common bile duct stones. Ultrasound is suggested for further evaluation. 2.  Multiple liver lesions, likely benign but recommend follow-up MRI examination without and with contrast for further evaluation.  Original Report Authenticated By: P. Loralie Champagne, M.D.   Dg Chest Port 1 View  01/20/2012  *RADIOLOGY REPORT*  Clinical Data: Shortness of breath.  Cough.  PORTABLE CHEST - 1 VIEW  Comparison: None.  Findings: Borderline heart size with normal pulmonary vascularity. Interstitial changes in the lungs which might represent chronic fibrosis or acute interstitial pneumonia/edema.  No blunting of costophrenic angles.  No pneumothorax.  Bilateral cervical ribs.  IMPRESSION: Interstitial changes in the lungs which might represent interstitial infiltration, edema, or fibrosis.  Original Report Authenticated By: Marlon Pel, M.D.     Anti-infectives:   Anti-infectives     Start     Dose/Rate Route Frequency Ordered Stop   01/20/12 0700  piperacillin-tazobactam (ZOSYN) IVPB 3.375 g       3.375 g 12.5 mL/hr over 240 Minutes Intravenous 3 times per day 01/20/12 0624     01/18/12 0400   ciprofloxacin (CIPRO) IVPB 400 mg  Status:  Discontinued        400 mg 200 mL/hr over 60 Minutes  Intravenous Every 12 hours 01/18/12 0340 01/20/12 0600   01/18/12 0400   metroNIDAZOLE (FLAGYL) IVPB 500 mg        500 mg 100 mL/hr over 60 Minutes Intravenous Every 8 hours 01/18/12 0340            Ovidio Kin, MD, FACS Pager: [phone],   [facility] Office: [phone] 01/20/2012

## 2012-01-20 NOTE — Progress Notes (Signed)
ANTIBIOTIC CONSULT NOTE - INITIAL  Pharmacy Consult for zosyn Indication: empiric   Allergies  Allergen Reactions  . Demerol Other (See Comments)    Unknown reaction    Patient Measurements: Height: 5\' 2"  (157.5 cm) Weight: 150 lb 2.1 oz (68.1 kg) IBW/kg (Calculated) : 50.1  Adjusted Body Weight:   Vital Signs: Temp: 97.9 F (36.6 C) (04/02 0111) Temp src: Oral (04/02 0111) BP: 127/97 mmHg (04/02 0500) Pulse Rate: 125  (04/02 0500) Intake/Output from previous day: 04/01 0701 - 04/02 0700 In: 556 [IV Piggyback:556] Out: 1550 [Urine:1550] Intake/Output from this shift: Total I/O In: 500 [IV Piggyback:500] Out: 750 [Urine:750]  Labs:  Basename 01/20/12 0410 01/19/12 0405 01/18/12 0728  WBC 7.4 4.5 8.6  HGB 11.0* 11.8* 11.8*  PLT 201 185 200  LABCREA -- -- --  CREATININE 0.64 0.56 0.56   Estimated Creatinine Clearance: 76.9 ml/min (by C-G formula based on Cr of 0.64). No results found for this basename: VANCOTROUGH:2,VANCOPEAK:2,VANCORANDOM:2,GENTTROUGH:2,GENTPEAK:2,GENTRANDOM:2,TOBRATROUGH:2,TOBRAPEAK:2,TOBRARND:2,AMIKACINPEAK:2,AMIKACINTROU:2,AMIKACIN:2, in the last 72 hours   Microbiology: Recent Results (from the past 720 hour(s))  MRSA PCR SCREENING     Status: Normal   Collection Time   01/18/12  5:06 AM      Component Value Range Status Comment   MRSA by PCR NEGATIVE  NEGATIVE  Final   URINE CULTURE     Status: Normal   Collection Time   01/18/12  5:15 AM      Component Value Range Status Comment   Specimen Description URINE, CATHETERIZED   Final    Special Requests NONE   Final    Culture  Setup Time [id number]   Final    Colony Count NO GROWTH   Final    Culture NO GROWTH   Final    Report Status 01/19/2012 FINAL   Final   CULTURE, BLOOD (SINGLE)     Status: Normal (Preliminary result)   Collection Time   01/18/12  7:28 AM      Component Value Range Status Comment   Specimen Description BLOOD RIGHT ARM   Final    Special Requests BOTTLES DRAWN  AEROBIC AND ANAEROBIC 10CC   Final    Culture  Setup Time [id number]   Final    Culture     Final    Value:        BLOOD CULTURE RECEIVED NO GROWTH TO DATE CULTURE WILL BE HELD FOR 5 DAYS BEFORE ISSUING A FINAL NEGATIVE REPORT   Report Status PENDING   Incomplete     Medical History: Past Medical History  Diagnosis Date  . Anemia   . Depression   . Neuromuscular disorder     Pt denies any history of this at all   . Chronic kidney disease     Unclear where this diagnosis came from   . Insomnia   . S/P partial hysterectomy     Medications:  Scheduled:    . buPROPion  150 mg Oral Daily  . furosemide  40 mg Intravenous Once  . heparin  5,000 Units Subcutaneous Q8H  .  HYDROmorphone (DILAUDID) injection  1 mg Intravenous Once  . levalbuterol  0.63 mg Nebulization Once  . methylPREDNISolone (SOLU-MEDROL) injection  80 mg Intravenous Once  . methylPREDNISolone sodium succinate      . metronidazole  500 mg Intravenous Q8H  . oxymetazoline  1 spray Each Nare BID  . pantoprazole (PROTONIX) IV  40 mg Intravenous Daily  . potassium chloride  10 mEq Intravenous Q1 Hr x 4  .  potassium chloride  10 mEq Intravenous Q1 Hr x 4  . sodium chloride  3 mL Intravenous Q12H  . spironolactone  50 mg Oral Once  . DISCONTD: ciprofloxacin  400 mg Intravenous Q12H   Infusions:    . 0.9 % NaCl with KCl 40 mEq / L 40 mL/hr (01/20/12 0446)  . DISCONTD: sodium chloride 75 mL/hr at 01/18/12 2221   Assessment: 51 yo F admitted with projectile n/v.  Meningitis ruled out via LP, not likely cholelithiasis but may need HIDA to r/o acute cholecystitis.  With respiratory distress this am.  Goal of Therapy:  Appropriate dosing.  Plan:  Zosyn 3.375G IV q8h given over 4h.  Kam Kushnir L. Illene Bolus, PharmD, BCPS Clinical Pharmacist Pager: [phone] 01/20/2012 6:22 AM

## 2012-01-21 ENCOUNTER — Inpatient Hospital Stay (HOSPITAL_COMMUNITY): Payer: 59

## 2012-01-21 DIAGNOSIS — D72829 Elevated white blood cell count, unspecified: Secondary | ICD-10-CM

## 2012-01-21 DIAGNOSIS — I959 Hypotension, unspecified: Secondary | ICD-10-CM | POA: Diagnosis present

## 2012-01-21 DIAGNOSIS — R51 Headache: Secondary | ICD-10-CM

## 2012-01-21 LAB — CBC
HCT: 32 % — ABNORMAL LOW (ref 36.0–46.0)
Hemoglobin: 10.9 g/dL — ABNORMAL LOW (ref 12.0–15.0)
MCH: 28.9 pg (ref 26.0–34.0)
MCHC: 34.1 g/dL (ref 30.0–36.0)
MCV: 84.9 fL (ref 78.0–100.0)
Platelets: 203 10*3/uL (ref 150–400)
RBC: 3.77 MIL/uL — ABNORMAL LOW (ref 3.87–5.11)
RDW: 13.1 % (ref 11.5–15.5)
WBC: 7.2 10*3/uL (ref 4.0–10.5)

## 2012-01-21 LAB — CLOSTRIDIUM DIFFICILE BY PCR: Toxigenic C. Difficile by PCR: NEGATIVE

## 2012-01-21 LAB — COMPREHENSIVE METABOLIC PANEL
ALT: 11 U/L (ref 0–35)
AST: 12 U/L (ref 0–37)
Albumin: 2.9 g/dL — ABNORMAL LOW (ref 3.5–5.2)
Alkaline Phosphatase: 44 U/L (ref 39–117)
BUN: 8 mg/dL (ref 6–23)
CO2: 26 mEq/L (ref 19–32)
Calcium: 8.2 mg/dL — ABNORMAL LOW (ref 8.4–10.5)
Chloride: 105 mEq/L (ref 96–112)
Creatinine, Ser: 0.59 mg/dL (ref 0.50–1.10)
GFR calc Af Amer: >90 mL/min (ref >90)
GFR calc non Af Amer: >90 mL/min (ref >90)
Glucose, Bld: 128 mg/dL — ABNORMAL HIGH (ref 70–99)
Potassium: 4 mEq/L (ref 3.5–5.1)
Sodium: 140 mEq/L (ref 135–145)
Total Bilirubin: 0.2 mg/dL — ABNORMAL LOW (ref 0.3–1.2)
Total Protein: 5.5 g/dL — ABNORMAL LOW (ref 6.0–8.3)

## 2012-01-21 LAB — GLUCOSE, CAPILLARY: Glucose-Capillary: 102 mg/dL — ABNORMAL HIGH (ref 70–99)

## 2012-01-21 MED ORDER — CLONAZEPAM 0.5 MG PO TABS
0.5000 mg | ORAL_TABLET | Freq: Every day | ORAL | Status: DC
  Administered 2012-01-21: 0.5 mg via ORAL
  Filled 2012-01-21: qty 1

## 2012-01-21 MED ORDER — PROMETHAZINE HCL 25 MG/ML IJ SOLN: 12.5000 mg | INTRAMUSCULAR | Status: DC | PRN

## 2012-01-21 MED ORDER — RISAQUAD PO CAPS
1.0000 | ORAL_CAPSULE | Freq: Two times a day (BID) | ORAL | Status: DC
  Filled 2012-01-21 (×5): qty 1

## 2012-01-21 MED ORDER — ADULT MULTIVITAMIN W/MINERALS CH
1.0000 | ORAL_TABLET | Freq: Every day | ORAL | Status: DC
  Filled 2012-01-21 (×2): qty 1

## 2012-01-21 MED ORDER — HYDROCODONE-ACETAMINOPHEN 5-325 MG PO TABS
1.0000 | ORAL_TABLET | Freq: Four times a day (QID) | ORAL | Status: DC | PRN
  Administered 2012-01-21: 1 via ORAL
  Filled 2012-01-21: qty 1

## 2012-01-21 NOTE — Progress Notes (Addendum)
Name: Megan Martin MRN: [id number] DOB: 06-Jul-1961    LOS: 3  PCCM ADMISSION NOTE  History of Present Illness: Hx of gastroenteritis, dyspnea likely due to mild volume overload   SUBJ: Pt much better, less dyspnea with aldactone.  Still with diarrhea with solid food  Lines / Drains: none  Cultures: UC 3/31>>neg BC 3/31>>>NGTD  Antibiotics: Zosyn 4/1 Flagyl 3/31  Tests / Events: CT abd: distended GB ,  ?liver lesions>>needs f/u    Past Medical History  Diagnosis Date  . Anemia   . Depression   . Neuromuscular disorder     Pt denies any history of this at all   . Chronic kidney disease     Unclear where this diagnosis came from   . Insomnia   . S/P partial hysterectomy    Past Surgical History  Procedure Date  . Cesarean section 1982, 1984  . Laparoscopic hysterectomy     still has cervix, ovaries, 1 tube  . Tonsillectomy   . Myomectomy   . Wisdom tooth extraction    Prior to Admission medications   Medication Sig Start Date End Date Taking? Authorizing Provider  buPROPion (WELLBUTRIN XL) 150 MG 24 hr tablet Take 150 mg by mouth daily.     Yes Ivy de Lawson Radar, MD  calcium-vitamin D (OSCAL WITH D) 500-200 MG-UNIT per tablet Take 1 tablet by mouth daily.   Yes Historical Provider, MD  cholecalciferol (VITAMIN D) 1000 UNITS tablet Take 1,000 Units by mouth daily.   Yes Historical Provider, MD  clonazePAM (KLONOPIN) 0.5 MG tablet Take 0.5 mg by mouth at bedtime.   Yes Historical Provider, MD  estradiol (VIVELLE-DOT) 0.1 MG/24HR Place 1 patch onto the skin 2 (two) times a week.     Yes Historical Provider, MD  Multiple Vitamin (MULITIVITAMIN WITH MINERALS) TABS Take 1 tablet by mouth daily.   Yes Historical Provider, MD  PRISTIQ 50 MG 24 hr tablet TAKE 1 TABLET BY MOUTH DAILY 08/21/11  Yes Ivy Tye Savoy, MD  tretinoin (RETIN-A) 0.025 % cream Apply 1 application topically at bedtime as needed. Apply to affected area   Yes Historical Provider, MD    Allergies Allergies  Allergen Reactions  . Demerol Other (See Comments)    Unknown reaction    Family History Family History  Problem Relation Age of Onset  . Heart disease Mother   . Hypertension Mother   . Heart disease Father   . Early death Father   . Heart disease Sister   . Hypertension Brother   . Hyperlipidemia Brother     Social History  reports that she has never smoked. She does not have any smokeless tobacco history on file. She reports that she does not drink alcohol or use illicit drugs.    Vital Signs: Temp:  [97.1 F (36.2 C)-98.7 F (37.1 C)] 98.5 F (36.9 C) (04/03 0800) Pulse Rate:  [76-85] 78  (04/03 0800) Resp:  [14-25] 25  (04/03 0800) BP: (101-120)/(58-74) 115/74 mmHg (04/03 0800) SpO2:  [92 %-99 %] 96 % (04/03 0800) Weight:  [62 kg (136 lb 11 oz)] 62 kg (136 lb 11 oz) (04/03 0036) I/O last 3 completed shifts: In: 2790 [P.O.:240; I.V.:1400; IV Piggyback:1150] Out: 7900 [Urine:7900]  Physical Examination: General:  Awake and alert, no distress Neuro:  intact   HEENT:  Mucus membranes moist,  Neck:  No jvd  Or tmg   Cardiovascular:  RRR  Nl s2 s1.  No m r h g  Lungs:  clear Abdomen:  Soft, non tender Musculoskeletal:  Intact, from Skin:  clear      Labs and Imaging:  Reviewed.  Lab 01/21/12 0410 01/18/12 0728  AST 12 14  ALT 11 13  ALKPHOS 44 48  BILITOT 0.2* 0.3  PROT 5.5* 5.1*  ALBUMIN 2.9* 2.8*  INR -- --    Lab 01/21/12 0410 01/20/12 0410 01/19/12 0405 01/18/12 0728  NA 140 140 140 142  K 4.0 3.3* -- --  CL 105 110 110 112  CO2 26 23 23 21   GLUCOSE 128* 93 148* 115*  BUN 8 6 7 17   CREATININE 0.59 0.64 0.56 0.56  CALCIUM 8.2* 7.7* 7.7* 6.9*  MG -- -- -- --  PHOS -- -- -- --    Lab 01/21/12 0410 01/20/12 0410 01/19/12 0405  HGB 10.9* 11.0* 11.8*  HCT 32.0* 33.0* 35.9*  WBC 7.2 7.4 4.5  PLT 203 201 185    Assessment and Plan: Patient Active Hospital Problem List:   Sepsis (01/18/2012)   Assessment:  resolved.  No evidence for severe sepsis, GI likely source d/t gastroenteritis   Plan: per Prim team    Nausea and vomiting (01/18/2012)   Assessment: resolving   Plan: per prim team  Leukocytosis (01/18/2012)   Assessment: resolved  Headache (01/18/2012)   Assessment: due to viral syndrome   Plan: resolved   Dyspnea with interstitial edema on CXR>>improved Assess: due to volume resuscitation Plan: reduce IVF, f/u CXR    IF CXR is improved, PCCM will sign off. ?transfer to regular bed?  Shan Levans  M.D. Pulmonary and Critical Care Medicine [facility] Cell: [phone]   Pager: [phone] 01/21/2012, 8:37 AM

## 2012-01-21 NOTE — Progress Notes (Addendum)
Pt. was placed on droplet precaution to r/o influenza on 4/2, Dr. Marin Shutter ordered to send a specimen for  infuenza pcr at that time. Result was in and notified Dr. Jarold Motto , with order to d/c resp. Isolation.

## 2012-01-21 NOTE — Progress Notes (Signed)
Subjective: She had some loose bowel movements associated with having a regular diet, and is having some bloating, but no nausea or vomiting or fever or chills. She is also not having any shortness of breath today.  Objective: Vital signs in last 24 hours: Temp:  [97.1 F (36.2 C)-98.5 F (36.9 C)] 98.1 F (36.7 C) (04/03 1700) Pulse Rate:  [72-85] 85  (04/03 1700) Resp:  [16-25] 23  (04/03 1700) BP: (101-121)/(68-78) 115/78 mmHg (04/03 1700) SpO2:  [96 %-98 %] 98 % (04/03 1700) Weight:  [62 kg (136 lb 11 oz)] 62 kg (136 lb 11 oz) (04/03 0036) Weight change: -6.1 kg (-13 lb 7.2 oz)   Intake/Output from previous day: 04/02 0701 - 04/03 0700 In: 1300 [I.V.:800; IV Piggyback:500] Out: 3700 [Urine:3700]   General appearance: alert, cooperative and no distress Resp: clear to auscultation bilaterally Cardio: regular rate and rhythm, S1, S2 normal, no murmur, click, rub or gallop GI: soft, non-tender; bowel sounds normal; no masses,  no organomegaly Extremities: extremities normal, atraumatic, no cyanosis or edema  Lab Results:  Basename 01/21/12 0410 01/20/12 0410  WBC 7.2 7.4  HGB 10.9* 11.0*  HCT 32.0* 33.0*  PLT 203 201   BMET  Basename 01/21/12 0410 01/20/12 0410  NA 140 140  K 4.0 3.3*  CL 105 110  CO2 26 23  GLUCOSE 128* 93  BUN 8 6  CREATININE 0.59 0.64  CALCIUM 8.2* 7.7*   CMET CMP     Component Value Date/Time   NA 140 01/21/2012 0410   K 4.0 01/21/2012 0410   CL 105 01/21/2012 0410   CO2 26 01/21/2012 0410   GLUCOSE 128* 01/21/2012 0410   BUN 8 01/21/2012 0410   CREATININE 0.59 01/21/2012 0410   CALCIUM 8.2* 01/21/2012 0410   PROT 5.5* 01/21/2012 0410   ALBUMIN 2.9* 01/21/2012 0410   AST 12 01/21/2012 0410   ALT 11 01/21/2012 0410   ALKPHOS 44 01/21/2012 0410   BILITOT 0.2* 01/21/2012 0410   GFRNONAA >90 01/21/2012 0410   GFRAA >90 01/21/2012 0410    CBG (last 3)   Basename 01/21/12 0838 01/20/12 0832 01/19/12 0837  GLUCAP 102* 118* 119*    INR RESULTS:   No results  found for this basename: INR, PROTIME     Studies/Results: Dg Chest Port 1 View  01/21/2012  *RADIOLOGY REPORT*  Clinical Data: Follow up pulmonary edema.  PORTABLE CHEST - 1 VIEW  Comparison: 01/20/2012.  Findings: Interval decrease in the pulmonary edema.  Pulmonary vascular congestion and small right pleural effusion remain.  No segmental consolidation or pneumothorax.  Heart size within normal limits.  IMPRESSION: Interval decrease in the degree of pulmonary edema.  Please see above.  Original Report Authenticated By: Fuller Canada, M.D.   Dg Chest Port 1 View  01/20/2012  *RADIOLOGY REPORT*  Clinical Data: Shortness of breath.  Cough.  PORTABLE CHEST - 1 VIEW  Comparison: None.  Findings: Borderline heart size with normal pulmonary vascularity. Interstitial changes in the lungs which might represent chronic fibrosis or acute interstitial pneumonia/edema.  No blunting of costophrenic angles.  No pneumothorax.  Bilateral cervical ribs.  IMPRESSION: Interstitial changes in the lungs which might represent interstitial infiltration, edema, or fibrosis.  Original Report Authenticated By: Marlon Pel, M.D.    Medications: I have reviewed the patient's current medications.  Assessment/Plan: #1 Nausea and Diarrhea: most likely from a viral source such as [place] virus infection given the high prevalence in the community and the lack  of significant leukocytosis.  She does not have a strong ongoing indication for use of broad-spectrum antibiotics which will be discontinued.  I will also start a probiotic to help normalize her bowel movements.  We had considered obtaining a HIDA scan as an inpatient, however will hold off on doing so due to the potential problem of a false positive test in the face of acute illness.  Today she will be transferred out of the stepdown ICU unit.  LOS: 3 days   Sharee Sturdy G 01/21/2012, 6:27 PM

## 2012-01-21 NOTE — Progress Notes (Addendum)
General Surgery Note  LOS: 3 days   Assessment/Plan:  1.  Cholelithiasis -    Probable incidental finding.  Can address this as an outpatient when she has recovered from this acute illness.   Note:  Patient said something about getting a HIDA scan prior to discharge.  I would not recommend this.  It will not change management decisions and with an acute illness, it could be falsely abnormal.  2.  Acute gastroenteritis.  Having loose stools x 2 per day, but she is clearly better.  Still some epigastric soreness.  C. Diff - 01/20/2012 - neg. 3.  Sepsis - Procalcitonin - 0.34 on 01/18/2012.  On Flagyl/Zosyn.  4.  Headache. 5..  Respiratory trouble this AM.   Much better.   Subjective:  Friend in room. She works in [employer] records.  Objective:   Filed Vitals:   01/21/12 0800  BP: 115/74  Pulse: 78  Temp: 98.5 F (36.9 C)  Resp: 25     Intake/Output from previous day:  04/02 0701 - 04/03 0700 In: 1300 [I.V.:800; IV Piggyback:500] Out: 3700 [Urine:3700]  Intake/Output this shift:  Total I/O In: -  Out: 200 [Urine:200]   Physical Exam:   General: Thin WN WF who is alert and oriented. But still does not feel well.   HEENT: Normal. Pupils equal. Good dentition. .   Lungs: Clear.   Abdomen: Soft, though she say she has epigastric pain.   Neurologic:  Grossly intact to motor and sensory function.   Psychiatric: Has normal mood and affect. Behavior is normal   Lab Results:     Basename 01/21/12 0410 01/20/12 0410  WBC 7.2 7.4  HGB 10.9* 11.0*  HCT 32.0* 33.0*  PLT 203 201    BMET    Basename 01/21/12 0410 01/20/12 0410  NA 140 140  K 4.0 3.3*  CL 105 110  CO2 26 23  GLUCOSE 128* 93  BUN 8 6  CREATININE 0.59 0.64  CALCIUM 8.2* 7.7*    PT/INR  No results found for this basename: LABPROT:2,INR:2 in the last 72 hours  ABG  No results found for this basename: PHART:2,PCO2:2,PO2:2,HCO3:2 in the last 72 hours   Studies/Results:  Dg Chest Port 1  View  01/20/2012  *RADIOLOGY REPORT*  Clinical Data: Shortness of breath.  Cough.  PORTABLE CHEST - 1 VIEW  Comparison: None.  Findings: Borderline heart size with normal pulmonary vascularity. Interstitial changes in the lungs which might represent chronic fibrosis or acute interstitial pneumonia/edema.  No blunting of costophrenic angles.  No pneumothorax.  Bilateral cervical ribs.  IMPRESSION: Interstitial changes in the lungs which might represent interstitial infiltration, edema, or fibrosis.  Original Report Authenticated By: Marlon Pel, M.D.     Anti-infectives:   Anti-infectives     Start     Dose/Rate Route Frequency Ordered Stop   01/20/12 0700  piperacillin-tazobactam (ZOSYN) IVPB 3.375 g       3.375 g 12.5 mL/hr over 240 Minutes Intravenous 3 times per day 01/20/12 0624     01/18/12 0400   ciprofloxacin (CIPRO) IVPB 400 mg  Status:  Discontinued        400 mg 200 mL/hr over 60 Minutes Intravenous Every 12 hours 01/18/12 0340 01/20/12 0600   01/18/12 0400   metroNIDAZOLE (FLAGYL) IVPB 500 mg        500 mg 100 mL/hr over 60 Minutes Intravenous Every 8 hours 01/18/12 0340            Onalee Hua  Ezzard Standing, MD, FACS Pager: [phone],   [facility] Office: [phone] 01/21/2012

## 2012-01-22 LAB — CBC
HCT: 33.3 % — ABNORMAL LOW (ref 36.0–46.0)
Hemoglobin: 11 g/dL — ABNORMAL LOW (ref 12.0–15.0)
MCH: 28.6 pg (ref 26.0–34.0)
MCHC: 33 g/dL (ref 30.0–36.0)
MCV: 86.5 fL (ref 78.0–100.0)
Platelets: 220 10*3/uL (ref 150–400)
RBC: 3.85 MIL/uL — ABNORMAL LOW (ref 3.87–5.11)
RDW: 13.5 % (ref 11.5–15.5)
WBC: 6.7 10*3/uL (ref 4.0–10.5)

## 2012-01-22 LAB — COMPREHENSIVE METABOLIC PANEL
ALT: 46 U/L — ABNORMAL HIGH (ref 0–35)
AST: 73 U/L — ABNORMAL HIGH (ref 0–37)
Albumin: 2.9 g/dL — ABNORMAL LOW (ref 3.5–5.2)
Alkaline Phosphatase: 47 U/L (ref 39–117)
BUN: 11 mg/dL (ref 6–23)
CO2: 27 mEq/L (ref 19–32)
Calcium: 7.9 mg/dL — ABNORMAL LOW (ref 8.4–10.5)
Chloride: 109 mEq/L (ref 96–112)
Creatinine, Ser: 0.64 mg/dL (ref 0.50–1.10)
GFR calc Af Amer: >90 mL/min (ref >90)
GFR calc non Af Amer: >90 mL/min (ref >90)
Glucose, Bld: 93 mg/dL (ref 70–99)
Potassium: 3.4 mEq/L — ABNORMAL LOW (ref 3.5–5.1)
Sodium: 142 mEq/L (ref 135–145)
Total Bilirubin: 0.2 mg/dL — ABNORMAL LOW (ref 0.3–1.2)
Total Protein: 5.2 g/dL — ABNORMAL LOW (ref 6.0–8.3)

## 2012-01-22 LAB — GLUCOSE, CAPILLARY: Glucose-Capillary: 86 mg/dL (ref 70–99)

## 2012-01-22 MED ORDER — RISAQUAD PO CAPS: 1.0000 | ORAL_CAPSULE | Freq: Two times a day (BID) | ORAL | Status: DC

## 2012-01-22 NOTE — Progress Notes (Signed)
Patient ID: Megan Martin, female   DOB: 1960-11-10, 51 y.o.   MRN: [id number]    Subjective: Pt feels ok.  Had some epigastric pain last pm.  Wanting more full liquids type food instead of solid food.  Objective: Vital signs in last 24 hours: Temp:  [98.1 F (36.7 C)-98.4 F (36.9 C)] 98.4 F (36.9 C) (04/04 0612) Pulse Rate:  [68-85] 68  (04/04 0612) Resp:  [18-23] 20  (04/04 0612) BP: (102-125)/(69-79) 102/72 mmHg (04/04 0612) SpO2:  [93 %-100 %] 93 % (04/04 0612) Last BM Date: 01/21/12  Intake/Output from previous day: 04/03 0701 - 04/04 0700 In: 738 [P.O.:240; I.V.:488; IV Piggyback:10] Out: 1175 [Urine:1175] Intake/Output this shift:    PE: Abd: soft, tender in epigastrum, but otherwise NT, ND, +BS  Lab Results:   [facility] 01/22/12 0638 01/21/12 0410  WBC 6.7 7.2  HGB 11.0* 10.9*  HCT 33.3* 32.0*  PLT 220 203   BMET  Basename 01/22/12 0638 01/21/12 0410  NA 142 140  K 3.4* 4.0  CL 109 105  CO2 27 26  GLUCOSE 93 128*  BUN 11 8  CREATININE 0.64 0.59  CALCIUM 7.9* 8.2*   PT/INR No results found for this basename: LABPROT:2,INR:2 in the last 72 hours CMP     Component Value Date/Time   NA 142 01/22/2012 0638   K 3.4* 01/22/2012 0638   CL 109 01/22/2012 0638   CO2 27 01/22/2012 0638   GLUCOSE 93 01/22/2012 0638   BUN 11 01/22/2012 0638   CREATININE 0.64 01/22/2012 0638   CALCIUM 7.9* 01/22/2012 0638   PROT 5.2* 01/22/2012 0638   ALBUMIN 2.9* 01/22/2012 0638   AST 73* 01/22/2012 0638   ALT 46* 01/22/2012 0638   ALKPHOS 47 01/22/2012 0638   BILITOT 0.2* 01/22/2012 0638   GFRNONAA >90 01/22/2012 0638   GFRAA >90 01/22/2012 0638   Lipase     Component Value Date/Time   LIPASE 13 01/18/2012 0728       Studies/Results: Dg Chest Port 1 View  01/21/2012  *RADIOLOGY REPORT*  Clinical Data: Follow up pulmonary edema.  PORTABLE CHEST - 1 VIEW  Comparison: 01/20/2012.  Findings: Interval decrease in the pulmonary edema.  Pulmonary vascular congestion and small right pleural effusion  remain.  No segmental consolidation or pneumothorax.  Heart size within normal limits.  IMPRESSION: Interval decrease in the degree of pulmonary edema.  Please see above.  Original Report Authenticated By: Fuller Canada, M.D.    Anti-infectives: Anti-infectives     Start     Dose/Rate Route Frequency Ordered Stop   01/20/12 0700   piperacillin-tazobactam (ZOSYN) IVPB 3.375 g  Status:  Discontinued        3.375 g 12.5 mL/hr over 240 Minutes Intravenous 3 times per day 01/20/12 0624 01/21/12 2138   01/18/12 0400   ciprofloxacin (CIPRO) IVPB 400 mg  Status:  Discontinued        400 mg 200 mL/hr over 60 Minutes Intravenous Every 12 hours 01/18/12 0340 01/20/12 0600   01/18/12 0400   metroNIDAZOLE (FLAGYL) IVPB 500 mg  Status:  Discontinued        500 mg 100 mL/hr over 60 Minutes Intravenous Every 8 hours 01/18/12 0340 01/21/12 1835           Assessment/Plan 1. Gastroenteritis 2. Incidental find of cholelithiasis  Plan: 1. Patient would like less solid food, such as full liquids.  I think this is fine.  If she wants to advance her diet on  her own when she feels more ready for solid food is ok. 2. Would have her return to see [place] in our office in 4-6 weeks to discuss elective cholecystectomy for gallstones. 3. Please call as neeeded.   LOS: 4 days    OSBORNE,KELLY E 01/22/2012  Agree with above.  DN.  Ovidio Kin, MD, [facility] Pager: [phone] Office phone:  [phone]

## 2012-01-22 NOTE — Discharge Summary (Signed)
Physician Discharge Summary  Patient ID: Megan Martin MRN: [id number] DOB/AGE: 1961/05/04 51 y.o.  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 Her initial blood pressure systolic was in the 90s and she had tachycardia with headache and photophobia. She had a head CT scan and lumbar puncture there were normal at the outside facility. She was transferred to this hospital for continued care. She was put on broad-spectrum antibiotics including ceftriaxone and IV fluids. At this facility she had a CT scan of the abdomen and pelvis done that showed a mildly distended gallbladder with some wall thickening, but no duct dilatation. She had a chest x-ray done that showed increased interstitial markings suggestive of a viral process. Her gallbladder ultrasound exam showed small gallstones without duct dilatation and a small amount of pericholecystic fluid, but a negative test for sonographic Murphy's sign. A general surgery consult and did not feel that she had acute cholecystitis and recommended consideration of obtaining a hiatus scan as an outpatient when her symptoms had resolved. Her antibiotics were discontinued and she did well only having minimal loose bowel movements. She had a stool C. difficile test that was normal. She had a full respiratory virus panel drawn that was significant solely for detection of coronavirus 229E positivity. She had no complications from her procedures. On the day of discharge she was able to eat breakfast without any problems.  She has not had any fever, chills, or shortness of breath today.  Consults: general surgery  Significant Diagnostic Studies:  Dg Chest Port 1 View  01/21/2012  *RADIOLOGY REPORT*  Clinical Data: Follow up pulmonary edema.  PORTABLE CHEST - 1 VIEW  Comparison: 01/20/2012.  Findings: Interval decrease in the pulmonary edema.  Pulmonary vascular congestion and small right pleural effusion remain.  No segmental consolidation or pneumothorax.  Heart size within normal limits.  IMPRESSION: Interval decrease in the degree of pulmonary edema.  Please see above.  Original Report Authenticated By: Fuller Canada, M.D.    Labs: Lab Results  Component Value Date   WBC 6.7 01/22/2012   HGB 11.0* 01/22/2012   HCT 33.3* 01/22/2012   MCV 86.5 01/22/2012   PLT 220 01/22/2012     Lab 01/22/12 0638  NA 142  K 3.4*  CL 109  CO2 27  BUN 11  CREATININE 0.64  CALCIUM 7.9*  PROT 5.2*  BILITOT 0.2*  ALKPHOS 47  ALT 46*  AST 73*  GLUCOSE 93       No results found for this basename: INR, PROTIME     Recent Results (from the past 240 hour(s))  MRSA PCR SCREENING     Status: Normal   Collection Time   01/18/12  5:06 AM      Component Value Range Status Comment   MRSA by PCR NEGATIVE  NEGATIVE  Final   URINE CULTURE     Status: Normal   Collection Time   01/18/12  5:15 AM      Component Value Range Status Comment   Specimen Description URINE, CATHETERIZED   Final    Special Requests NONE  Final    Culture  Setup Time [id number]   Final    Colony Count NO GROWTH   Final    Culture NO GROWTH   Final    Report Status 01/19/2012 FINAL   Final   CULTURE, BLOOD (SINGLE)     Status: Normal (Preliminary result)   Collection Time   01/18/12  7:28 AM      Component Value Range Status Comment   Specimen Description BLOOD RIGHT ARM   Final    Special Requests BOTTLES DRAWN AEROBIC AND ANAEROBIC 10CC   Final    Culture  Setup Time [id number]   Final    Culture     Final    Value:        BLOOD CULTURE  RECEIVED NO GROWTH TO DATE CULTURE WILL BE HELD FOR 5 DAYS BEFORE ISSUING A FINAL NEGATIVE REPORT   Report Status PENDING   Incomplete   RESPIRATORY VIRUS PANEL (18 COMPONENTS)     Status: Abnormal (Preliminary result)   Collection Time   01/20/12 11:36 AM      Component Value Range Status Comment   Source - RVPAN NASAL   Final    Respiratory Syncytial Virus A NOT DETECTED   Final    Respiratory Syncytial Virus B NOT DETECTED   Final    Influenza A NOT DETECTED   Final    Influenza B NOT DETECTED   Final    Parainfluenza 1 NOT DETECTED   Final    Parainfluenza 2 NOT DETECTED   Final    Parainfluenza 3 NOT DETECTED   Final    Parainfluenza 4 NOT DETECTED   Final    Metapneumovirus NOT DETECTED   Final    [place] and Echovirus NOT DETECTED   Final    Rhinovirus NOT DETECTED   Final    Adenovirus B NOT DETECTED   Final    Adenovirus E NOT DETECTED   Final    CoronavirusNL63 NOT DETECTED   Final    CoronavirusHKU1 NOT DETECTED   Final    Coronavirus229E DETECTED (*)  Final    CoronavirusOC43 NOT DETECTED   Final    Bocavirus PENDING   Incomplete   STOOL CULTURE     Status: Normal (Preliminary result)   Collection Time   01/20/12  6:27 PM      Component Value Range Status Comment   Specimen Description STOOL   Final    Special Requests NONE   Final    Culture Culture reincubated for better growth   Final    Report Status PENDING   Incomplete   CLOSTRIDIUM DIFFICILE BY PCR     Status: Normal   Collection Time   01/20/12  6:27 PM      Component Value Range Status Comment   C difficile by pcr NEGATIVE  NEGATIVE  Final       Discharge Exam: Blood pressure 102/72, pulse 68, temperature 98.4 F (36.9 C), temperature source Oral, resp. rate 20, height 5\' 2"  (1.575 m), weight 62 kg (136 lb 11 oz), SpO2 93.00%.  Physical Exam: In general, she is a well-nourished well-developed white woman who was in no apparent distress. HEENT exam was within normal limits, neck was supple without jugular  venous distention or carotid bruit, chest was clear to auscultation, heart had a regular rate and rhythm, abdomen had normal bowel sounds and no hepatosplenomegaly or tenderness, extremities were without cyanosis, clubbing, or edema, neurological exam was nonfocal.  Disposition: She'll be  discharged to home today. She will continue treatment with a probiotic to help prevent C. difficile colitis. She will have a followup visit in our office in approximately 7 days following discharge and was advised to call us if she develops worrisome symptoms. She also will have a followup visit with Dr. Ovidio Kin in the near future.  Discharge Orders    Future Orders Please Complete By Expires   Diet - low sodium heart healthy      Increase activity slowly      Discharge instructions      Comments:   She should call our office today at [phone] to schedule a followup visit with Dr. Clelia Croft within the next week. She should also call our office if she develops worsening symptoms such as severe nausea, vomiting, severe diarrhea, fever, or chills.     Medication List  As of 01/22/2012  9:15 AM   TAKE these medications         acidophilus Caps   Take 1 capsule by mouth 2 (two) times daily.      buPROPion 150 MG 24 hr tablet   Commonly known as: WELLBUTRIN XL   Take 150 mg by mouth daily.      calcium-vitamin D 500-200 MG-UNIT per tablet   Commonly known as: OSCAL WITH D   Take 1 tablet by mouth daily.      cholecalciferol 1000 UNITS tablet   Commonly known as: VITAMIN D   Take 1,000 Units by mouth daily.      clonazePAM 0.5 MG tablet   Commonly known as: KLONOPIN   Take 0.5 mg by mouth at bedtime.      estradiol 0.1 MG/24HR   Commonly known as: VIVELLE-DOT   Place 1 patch onto the skin 2 (two) times a week.      mulitivitamin with minerals Tabs   Take 1 tablet by mouth daily.      PRISTIQ 50 MG 24 hr tablet   Generic drug: desvenlafaxine   TAKE 1 TABLET BY MOUTH DAILY      tretinoin 0.025 %  cream   Commonly known as: RETIN-A   Apply 1 application topically at bedtime as needed. Apply to affected area           Follow-up Information    Schedule an appointment as soon as possible for a visit with NEWMAN,DAVID H, MD. (4-6 weeks)    Contact information:   [facility] [address] [place] [address] [phone]          Signed: Garlan Fillers 01/22/2012, 9:15 AM

## 2012-01-22 NOTE — Progress Notes (Signed)
DC home with husband. Pt verbally understood DC instructions. No questions or concerns asked at this time.

## 2012-01-24 LAB — CULTURE, BLOOD (SINGLE)
Culture  Setup Time: 201303311657
Culture: NO GROWTH

## 2012-01-24 LAB — STOOL CULTURE

## 2012-01-26 LAB — RESPIRATORY VIRUS PANEL
Adenovirus B: NOT DETECTED
Adenovirus E: NOT DETECTED
Coronavirus229E: DETECTED — AB
CoronavirusHKU1: NOT DETECTED
CoronavirusNL63: NOT DETECTED
CoronavirusOC43: NOT DETECTED
Coxsackie and Echovirus: NOT DETECTED
Influenza A: NOT DETECTED
Influenza B: NOT DETECTED
Metapneumovirus: NOT DETECTED
Parainfluenza 1: NOT DETECTED
Parainfluenza 2: NOT DETECTED
Parainfluenza 3: NOT DETECTED
Parainfluenza 4: NOT DETECTED
Respiratory Syncytial Virus A: NOT DETECTED
Respiratory Syncytial Virus B: NOT DETECTED
Rhinovirus: NOT DETECTED

## 2012-03-29 ENCOUNTER — Encounter: Payer: Self-pay | Admitting: Gastroenterology

## 2012-05-04 ENCOUNTER — Ambulatory Visit (AMBULATORY_SURGERY_CENTER): Payer: 59

## 2012-05-04 VITALS — Ht 62.5 in | Wt 132.1 lb

## 2012-05-04 DIAGNOSIS — Z1211 Encounter for screening for malignant neoplasm of colon: Secondary | ICD-10-CM

## 2012-05-04 MED ORDER — MOVIPREP 100 G PO SOLR: ORAL | Status: DC

## 2012-05-18 ENCOUNTER — Encounter: Payer: Commercial Managed Care - PPO | Admitting: Gastroenterology

## 2012-06-15 ENCOUNTER — Encounter: Payer: 59 | Admitting: Gastroenterology

## 2012-12-04 ENCOUNTER — Other Ambulatory Visit: Payer: Self-pay

## 2013-08-08 ENCOUNTER — Encounter: Payer: Self-pay | Admitting: Gastroenterology

## 2013-08-25 ENCOUNTER — Other Ambulatory Visit: Payer: Self-pay

## 2013-10-28 ENCOUNTER — Encounter: Payer: 59 | Admitting: Gastroenterology

## 2014-08-04 ENCOUNTER — Other Ambulatory Visit: Payer: Self-pay

## 2015-02-14 ENCOUNTER — Encounter: Payer: Self-pay | Admitting: Gastroenterology

## 2015-04-17 ENCOUNTER — Ambulatory Visit (AMBULATORY_SURGERY_CENTER): Payer: Self-pay

## 2015-04-17 VITALS — Ht 62.0 in | Wt 148.0 lb

## 2015-04-17 DIAGNOSIS — Z800 Family history of malignant neoplasm of digestive organs: Secondary | ICD-10-CM

## 2015-04-17 NOTE — Progress Notes (Signed)
No allergies to eggs or soy No diet/weight loss meds No past problems with anesthesia EXCEPT PONV WITH GENERAL ANESTHESIA No home oxygen  Has email  Emmi instructions given for colonoscopy

## 2015-05-01 ENCOUNTER — Encounter: Payer: Self-pay | Admitting: Gastroenterology

## 2015-05-01 ENCOUNTER — Ambulatory Visit (AMBULATORY_SURGERY_CENTER): Payer: 59 | Admitting: Gastroenterology

## 2015-05-01 VITALS — BP 104/68 | HR 71 | Temp 96.2°F | Resp 15 | Ht 62.0 in | Wt 148.0 lb

## 2015-05-01 DIAGNOSIS — D125 Benign neoplasm of sigmoid colon: Secondary | ICD-10-CM

## 2015-05-01 DIAGNOSIS — Z1211 Encounter for screening for malignant neoplasm of colon: Secondary | ICD-10-CM | POA: Diagnosis not present

## 2015-05-01 HISTORY — PX: COLONOSCOPY: SHX174

## 2015-05-01 MED ORDER — SODIUM CHLORIDE 0.9 % IV SOLN
500.0000 mL | INTRAVENOUS | Status: DC
Start: 2015-05-01 — End: 2015-05-01

## 2015-05-01 NOTE — Patient Instructions (Signed)
YOU HAD AN ENDOSCOPIC PROCEDURE TODAY AT [facility]:   Refer to the procedure report that was given to you for any specific questions about what was found during the examination.  If the procedure report does not answer your questions, please call your gastroenterologist to clarify.  If you requested that your care partner not be given the details of your procedure findings, then the procedure report has been included in a sealed envelope for you to review at your convenience later.  YOU SHOULD EXPECT: Some feelings of bloating in the abdomen. Passage of more gas than usual.  Walking can help get rid of the air that was put into your GI tract during the procedure and reduce the bloating. If you had a lower endoscopy (such as a colonoscopy or flexible sigmoidoscopy) you may notice spotting of blood in your stool or on the toilet paper. If you underwent a bowel prep for your procedure, you may not have a normal bowel movement for a few days.  Please Note:  You might notice some irritation and congestion in your nose or some drainage.  This is from the oxygen used during your procedure.  There is no need for concern and it should clear up in a day or so.  SYMPTOMS TO REPORT IMMEDIATELY:   Following lower endoscopy (colonoscopy or flexible sigmoidoscopy):  Excessive amounts of blood in the stool  Significant tenderness or worsening of abdominal pains  Swelling of the abdomen that is new, acute  Fever of 100F or higher    For urgent or emergent issues, a gastroenterologist can be reached at any hour by calling [phone].   DIET: Your first meal following the procedure should be a small meal and then it is ok to progress to your normal diet. Heavy or fried foods are harder to digest and may make you feel nauseous or bloated.  Likewise, meals heavy in dairy and vegetables can increase bloating.  Drink plenty of fluids but you should avoid alcoholic beverages for 24  hours.  ACTIVITY:  You should plan to take it easy for the rest of today and you should NOT DRIVE or use heavy machinery until tomorrow (because of the sedation medicines used during the test).    FOLLOW UP: Our staff will call the number listed on your records the next business day following your procedure to check on you and address any questions or concerns that you may have regarding the information given to you following your procedure. If we do not reach you, we will leave a message.  However, if you are feeling well and you are not experiencing any problems, there is no need to return our call.  We will assume that you have returned to your regular daily activities without incident.  If any biopsies were taken you will be contacted by phone or by letter within the next 1-3 weeks.  Please call us at [phone] if you have not heard about the biopsies in 3 weeks.    SIGNATURES/CONFIDENTIALITY: You and/or your care partner have signed paperwork which will be entered into your electronic medical record.  These signatures attest to the fact that that the information above on your After Visit Summary has been reviewed and is understood.  Full responsibility of the confidentiality of this discharge information lies with you and/or your care-partner.  Repeat Colonoscopy in 5 years Await pathology report

## 2015-05-01 NOTE — Progress Notes (Signed)
Report to PACU, RN, vss, BBS= Clear.  

## 2015-05-01 NOTE — Op Note (Signed)
[facility]  [address] [place], [id number]   COLONOSCOPY PROCEDURE REPORT  PATIENT: Megan, Martin  MR#: [id number] BIRTHDATE: [birth year] , 37  yrs. old GENDER: female ENDOSCOPIST: Ladene Artist, MD, [facility] REFERRED BY:W.  Lutricia Feil, M.D. PROCEDURE DATE:  05/01/2015 PROCEDURE:   Colonoscopy, screening and Colonoscopy with biopsy First Screening Colonoscopy - Avg.  risk and is 50 yrs.  old or older Yes.  Prior Negative Screening - Now for repeat screening. N/A  History of Adenoma - Now for follow-up colonoscopy & has been > or = to 3 yrs.  N/A  Polyps removed today? Yes ASA CLASS:   Class II INDICATIONS:Screening for colonic neoplasia and Colorectal Neoplasm Risk Assessment for this procedure is average risk. MEDICATIONS: Monitored anesthesia care and Propofol 200 mg IV DESCRIPTION OF PROCEDURE:   After the risks benefits and alternatives of the procedure were thoroughly explained, informed consent was obtained.  The digital rectal exam revealed no abnormalities of the rectum.   The [id number]  endoscope was introduced through the anus and advanced to the cecum, which was identified by both the appendix and ileocecal valve. No adverse events experienced.   The quality of the prep was excellent. (Suprep was used)  The instrument was then slowly withdrawn as the colon was fully examined. Estimated blood loss is zero unless otherwise noted in this procedure report.    COLON FINDINGS: A sessile polyp measuring 5 mm in size was found in the sigmoid colon.  A polypectomy was performed with cold forceps. The resection was complete, the polyp tissue was completely retrieved and sent to histology.   The examination was otherwise normal.  Retroflexed views revealed no abnormalities. The time to cecum = 2.8 Withdrawal time = 10.4   The scope was withdrawn and the procedure completed. COMPLICATIONS: There were no immediate complications.  ENDOSCOPIC  IMPRESSION: 1.   Sessile polyp in the sigmoid colon; polypectomy performed with cold forceps 2.   The examination was otherwise normal  RECOMMENDATIONS: 1.  Await pathology results 2.  Repeat colonoscopy in 5 years if polyp adenomatous; otherwise 10 years  eSigned:  Ladene Artist, MD, [facility] 05/01/2015 9:32 AM

## 2015-05-01 NOTE — Progress Notes (Signed)
Called to room to assist during endoscopic procedure.  Patient ID and intended procedure confirmed with present staff. Received instructions for my participation in the procedure from the performing physician.  

## 2015-05-02 ENCOUNTER — Telehealth: Payer: Self-pay | Admitting: *Deleted

## 2015-05-02 NOTE — Telephone Encounter (Signed)
  Follow up Call-  Call back number 05/01/2015  Post procedure Call Back phone  # [phone]  Permission to leave phone message Yes   [facility]

## 2015-05-10 ENCOUNTER — Encounter: Payer: Self-pay | Admitting: Gastroenterology

## 2015-10-29 ENCOUNTER — Emergency Department (HOSPITAL_BASED_OUTPATIENT_CLINIC_OR_DEPARTMENT_OTHER)
Admission: EM | Admit: 2015-10-29 | Discharge: 2015-10-29 | Disposition: A | Discharge status: Home / Self Care | Payer: 59 | Attending: Emergency Medicine | Admitting: Emergency Medicine

## 2015-10-29 ENCOUNTER — Encounter (HOSPITAL_BASED_OUTPATIENT_CLINIC_OR_DEPARTMENT_OTHER): Payer: Self-pay | Admitting: Emergency Medicine

## 2015-10-29 DIAGNOSIS — F329 Major depressive disorder, single episode, unspecified: Secondary | ICD-10-CM | POA: Insufficient documentation

## 2015-10-29 DIAGNOSIS — R51 Headache: Secondary | ICD-10-CM | POA: Insufficient documentation

## 2015-10-29 DIAGNOSIS — N189 Chronic kidney disease, unspecified: Secondary | ICD-10-CM | POA: Insufficient documentation

## 2015-10-29 DIAGNOSIS — Z3202 Encounter for pregnancy test, result negative: Secondary | ICD-10-CM | POA: Diagnosis not present

## 2015-10-29 DIAGNOSIS — Z90711 Acquired absence of uterus with remaining cervical stump: Secondary | ICD-10-CM | POA: Insufficient documentation

## 2015-10-29 DIAGNOSIS — Z87440 Personal history of urinary (tract) infections: Secondary | ICD-10-CM | POA: Diagnosis not present

## 2015-10-29 DIAGNOSIS — G4700 Insomnia, unspecified: Secondary | ICD-10-CM | POA: Insufficient documentation

## 2015-10-29 DIAGNOSIS — Z79899 Other long term (current) drug therapy: Secondary | ICD-10-CM | POA: Insufficient documentation

## 2015-10-29 DIAGNOSIS — Z862 Personal history of diseases of the blood and blood-forming organs and certain disorders involving the immune mechanism: Secondary | ICD-10-CM | POA: Diagnosis not present

## 2015-10-29 DIAGNOSIS — R112 Nausea with vomiting, unspecified: Secondary | ICD-10-CM | POA: Diagnosis not present

## 2015-10-29 LAB — COMPREHENSIVE METABOLIC PANEL
ALT: 17 U/L (ref 14–54)
AST: 19 U/L (ref 15–41)
Albumin: 3.9 g/dL (ref 3.5–5.0)
Alkaline Phosphatase: 73 U/L (ref 38–126)
Anion gap: 6 (ref 5–15)
BUN: 16 mg/dL (ref 6–20)
CO2: 26 mmol/L (ref 22–32)
Calcium: 8.3 mg/dL — ABNORMAL LOW (ref 8.9–10.3)
Chloride: 106 mmol/L (ref 101–111)
Creatinine, Ser: 0.66 mg/dL (ref 0.44–1.00)
GFR calc Af Amer: >60 mL/min (ref >60)
GFR calc non Af Amer: >60 mL/min (ref >60)
Glucose, Bld: 104 mg/dL — ABNORMAL HIGH (ref 65–99)
Potassium: 3.7 mmol/L (ref 3.5–5.1)
Sodium: 138 mmol/L (ref 135–145)
Total Bilirubin: 0.5 mg/dL (ref 0.3–1.2)
Total Protein: 6.7 g/dL (ref 6.5–8.1)

## 2015-10-29 LAB — CBC
HCT: 44.3 % (ref 36.0–46.0)
Hemoglobin: 14.5 g/dL (ref 12.0–15.0)
MCH: 28.3 pg (ref 26.0–34.0)
MCHC: 32.7 g/dL (ref 30.0–36.0)
MCV: 86.4 fL (ref 78.0–100.0)
Platelets: 272 10*3/uL (ref 150–400)
RBC: 5.13 MIL/uL — ABNORMAL HIGH (ref 3.87–5.11)
RDW: 12.8 % (ref 11.5–15.5)
WBC: 5.3 10*3/uL (ref 4.0–10.5)

## 2015-10-29 LAB — URINALYSIS, ROUTINE W REFLEX MICROSCOPIC
Glucose, UA: NEGATIVE mg/dL
Hgb urine dipstick: NEGATIVE
Ketones, ur: 40 mg/dL — AB
Leukocytes, UA: NEGATIVE
Nitrite: NEGATIVE
Protein, ur: NEGATIVE mg/dL
Specific Gravity, Urine: 1.04 — ABNORMAL HIGH (ref 1.005–1.030)
pH: 5.5 (ref 5.0–8.0)

## 2015-10-29 LAB — URINE MICROSCOPIC-ADD ON

## 2015-10-29 LAB — LIPASE, BLOOD: Lipase: 23 U/L (ref 11–51)

## 2015-10-29 LAB — PREGNANCY, URINE: Preg Test, Ur: NEGATIVE

## 2015-10-29 MED ORDER — METOCLOPRAMIDE HCL 5 MG/ML IJ SOLN
10.0000 mg | Freq: Once | INTRAMUSCULAR | Status: AC
  Administered 2015-10-29: 10 mg via INTRAVENOUS
  Filled 2015-10-29: qty 2

## 2015-10-29 MED ORDER — SODIUM CHLORIDE 0.9 % IV BOLUS (SEPSIS)
2000.0000 mL | Freq: Once | INTRAVENOUS | Status: AC
  Administered 2015-10-29: 2000 mL via INTRAVENOUS

## 2015-10-29 MED ORDER — KETOROLAC TROMETHAMINE 30 MG/ML IJ SOLN
30.0000 mg | Freq: Once | INTRAMUSCULAR | Status: AC
  Administered 2015-10-29: 30 mg via INTRAVENOUS
  Filled 2015-10-29: qty 1

## 2015-10-29 MED ORDER — ONDANSETRON HCL 4 MG PO TABS: 4.0000 mg | ORAL_TABLET | Freq: Three times a day (TID) | ORAL | Status: DC | PRN

## 2015-10-29 MED ORDER — SODIUM CHLORIDE 0.9 % IV BOLUS (SEPSIS): 1000.0000 mL | Freq: Once | INTRAVENOUS | Status: DC

## 2015-10-29 NOTE — Discharge Instructions (Signed)
Nausea and Vomiting Ms. Megan Martin, your blood work today was normal.  Take zofran at home as needed for nausea.  See your primary doctor within 3 days for close follow up. If symptoms worsen, come back to the ED immediately. Thank you. Nausea means you feel sick to your stomach. Throwing up (vomiting) is a reflex where stomach contents come out of your mouth. HOME CARE   Take medicine as told by your doctor.  Do not force yourself to eat. However, you do need to drink fluids.  If you feel like eating, eat a normal diet as told by your doctor.  Eat rice, wheat, potatoes, bread, lean meats, yogurt, fruits, and vegetables.  Avoid high-fat foods.  Drink enough fluids to keep your pee (urine) clear or pale yellow.  Ask your doctor how to replace body fluid losses (rehydrate). Signs of body fluid loss (dehydration) include:  Feeling very thirsty.  Dry lips and mouth.  Feeling dizzy.  Dark pee.  Peeing less than normal.  Feeling confused.  Fast breathing or heart rate. GET HELP RIGHT AWAY IF:   You have blood in your throw up.  You have black or bloody poop (stool).  You have a bad headache or stiff neck.  You feel confused.  You have bad belly (abdominal) pain.  You have chest pain or trouble breathing.  You do not pee at least once every 8 hours.  You have cold, clammy skin.  You keep throwing up after 24 to 48 hours.  You have a fever. MAKE SURE YOU:   Understand these instructions.  Will watch your condition.  Will get help right away if you are not doing well or get worse.   This information is not intended to replace advice given to you by your health care provider. Make sure you discuss any questions you have with your health care provider.   Document Released: 03/24/2008 Document Revised: 12/29/2011 Document Reviewed: 03/07/2011 Elsevier Interactive Patient Education [employer].

## 2015-10-29 NOTE — ED Provider Notes (Signed)
CSN: [id number]     Arrival date & time 10/29/15  0050 History   First MD Initiated Contact with Patient 10/29/15 0103     Chief Complaint  Patient presents with  . Emesis  . Headache     (Consider location/radiation/quality/duration/timing/severity/associated sxs/prior Treatment) HPI   Megan Martin is a 55 y.o. female with no significant past medical history presenting today with nausea and vomiting. Patient states this began approximately 3 days ago. She is not able to tolerate anything by mouth. She has tried to take Tylenol for fever and drink water and she had emesis with this. This happened to her once before 2013 in which she said she is admitted for sepsis but they could not find a source. She has had gallbladder problems in the past. She denies any diarrhea. No one else around her has been ill however she does work in [employer]. Patient denies any urinary symptoms. There are no further complaints. Pain is midepigastric and left upper quadrant. Nonradiating.  10 Systems reviewed and are negative for acute change except as noted in the HPI.     Past Medical History  Diagnosis Date  . Anemia   . Depression   . Neuromuscular disorder ([place])     Pt denies any history of this at all   . Chronic kidney disease     Unclear where this diagnosis came from   . Insomnia   . S/P partial hysterectomy   . UTI (lower urinary tract infection)    Past Surgical History  Procedure Laterality Date  . Cesarean section  1982, 1984  . Laparoscopic hysterectomy      still has cervix, ovaries, 1 tube  . Tonsillectomy    . Myomectomy    . Wisdom tooth extraction    . Sinus irrigations      two times /1968  . Blepharoplasty      bil lower   Family History  Problem Relation Age of Onset  . Heart disease Mother   . Hypertension Mother   . Heart disease Father   . Early death Father   . Hypertension Brother   . Hyperlipidemia Brother   . Heart disease Brother   . Colon cancer Paternal  Uncle    Social History  Substance Use Topics  . Smoking status: Never Smoker   . Smokeless tobacco: Never Used  . Alcohol Use: No   OB History    No data available     Review of Systems    Allergies  Demerol  Home Medications   Prior to Admission medications   Medication Sig Start Date End Date Taking? Authorizing Provider  calcium-vitamin D (OSCAL WITH D) 500-200 MG-UNIT per tablet Take 1 tablet by mouth daily.    Historical Provider, MD  cholecalciferol (VITAMIN D) 1000 UNITS tablet Take by mouth daily. Take 5000 units daily    Historical Provider, MD  clonazePAM (KLONOPIN) 0.5 MG tablet Take 0.5 mg by mouth at bedtime.    Historical Provider, MD  estradiol (VIVELLE-DOT) 0.1 MG/24HR Place 1 patch onto the skin 2 (two) times a week.      Historical Provider, MD  ondansetron (ZOFRAN) 4 MG tablet Take 1 tablet (4 mg total) by mouth every 8 (eight) hours as needed for nausea or vomiting. 10/29/15   Everlene Balls, MD  PRISTIQ 50 MG 24 hr tablet TAKE 1 TABLET BY MOUTH DAILY 08/21/11   [place], DO   BP 125/85 mmHg  Pulse 85  Temp(Src) 98.4 F (36.9 C) (Oral)  Resp 16  Ht 5\' 2"  (1.575 m)  Wt 152 lb (68.947 kg)  BMI 27.79 kg/m2  SpO2 100% Physical Exam  Constitutional: She is oriented to person, place, and time. She appears well-developed and well-nourished. No distress.  HENT:  Head: Normocephalic and atraumatic.  Nose: Nose normal.  Mouth/Throat: Oropharynx is clear and moist. No oropharyngeal exudate.  Eyes: Conjunctivae and EOM are normal. Pupils are equal, round, and reactive to light. No scleral icterus.  Neck: Normal range of motion. Neck supple. No JVD present. No tracheal deviation present. No thyromegaly present.  Cardiovascular: Normal rate, regular rhythm and normal heart sounds.  Exam reveals no gallop and no friction rub.   No murmur heard. Pulmonary/Chest: Effort normal and breath sounds normal. No respiratory distress. She has no wheezes. She exhibits no  tenderness.  Abdominal: Soft. Bowel sounds are normal. She exhibits no distension and no mass. There is no tenderness. There is no rebound and no guarding.  Musculoskeletal: Normal range of motion. She exhibits no edema or tenderness.  Lymphadenopathy:    She has no cervical adenopathy.  Neurological: She is alert and oriented to person, place, and time. No cranial nerve deficit. She exhibits normal muscle tone.  Skin: Skin is warm and dry. No rash noted. No erythema. No pallor.  Nursing note and vitals reviewed.   ED Course  Procedures (including critical care time) Labs Review Labs Reviewed  COMPREHENSIVE METABOLIC PANEL - Abnormal; Notable for the following:    Glucose, Bld 104 (*)    Calcium 8.3 (*)    All other components within normal limits  CBC - Abnormal; Notable for the following:    RBC 5.13 (*)    All other components within normal limits  URINALYSIS, ROUTINE W REFLEX MICROSCOPIC (NOT AT [facility]) - Abnormal; Notable for the following:    Color, Urine AMBER (*)    APPearance TURBID (*)    Specific Gravity, Urine 1.040 (*)    Bilirubin Urine SMALL (*)    Ketones, ur 40 (*)    All other components within normal limits  URINE MICROSCOPIC-ADD ON - Abnormal; Notable for the following:    Squamous Epithelial / LPF 6-30 (*)    Bacteria, UA FEW (*)    Crystals CA OXALATE CRYSTALS (*)    All other components within normal limits  LIPASE, BLOOD  PREGNANCY, URINE    Imaging Review No results found. I have personally reviewed and evaluated these images and lab results as part of my medical decision-making.   EKG Interpretation None      MDM   Final diagnoses:  Non-intractable vomiting with nausea, vomiting of unspecified type    Patient presents to the emergency department for nausea and vomiting. Her physical exam is unremarkable. No signs of dehydration, no abdominal tenderness. I perform bedside ultrasound which did not show any gallbladder pathology. Due to length  of symptoms we'll obtain laboratory studies and provide IV fluids. Patient given 2 L of IV fluids, Reglan, Toradol for her symptoms. Upon repeat evaluation, patient states she feels much better. She is asking for Sprite and tolerated this well. Laboratory studies are unremarkable as a cause of her nausea and vomiting. She is advised to see her primary care physician within 3 days for close follow-up. She appears well in no acute distress, vital signs were within her normal limits and she is safe for discharge.   EMERGENCY DEPARTMENT US GALLBLADDER INTERPRETATION "Study: Limited Ultrasound of the  gallbladder and common bile duct."  INDICATIONS: Vomiting Indication: Multiple views of the gallbladder and common bile duct are obtained with a  Multi-frequency probe."  PERFORMED BY:  Myself  IMAGES ARCHIVED?: Yes  FINDINGS: Gallstones absent  LIMITATIONS: None  INTERPRETATION: Normal  COMMENT:  Normal study    Everlene Balls, MD 10/29/15 0246

## 2015-10-29 NOTE — ED Notes (Signed)
Pt reports onset of not feeling well 5 days ago, emesis and fevers, now onset headache today that has not improved with OTC med.

## 2015-10-30 MED FILL — PRISTIQ ER 50 MG TABLET: 50 | 90 days supply | Qty: 90 | Fill #0

## 2015-10-31 MED FILL — BUPROPION HCL XL 150 MG TAB: 150 | 30 days supply | Qty: 30 | Fill #0

## 2015-11-19 MED FILL — clonazePAM 0.5 MG TABS: 0.5 | 30 days supply | Qty: 60 | Fill #3

## 2015-11-30 MED FILL — BUPROPION HCL XL 150 MG TAB: 150 | 30 days supply | Qty: 30 | Fill #1

## 2015-12-20 MED FILL — clonazePAM 0.5 MG TABS: 0.5 | 30 days supply | Qty: 60 | Fill #4

## 2015-12-26 MED FILL — BUPROPION HCL XL 150 MG TAB: 150 | 30 days supply | Qty: 30 | Fill #2

## 2016-01-17 MED FILL — clonazePAM 0.5 MG TABS: 0.5 | 30 days supply | Qty: 60 | Fill #0

## 2016-02-04 MED FILL — ESTRADIOL 0.1 MG PATCH: 0.1 | 28 days supply | Qty: 8 | Fill #0

## 2016-02-04 MED FILL — BUPROPION HCL XL 150 MG TAB: 150 | 30 days supply | Qty: 30 | Fill #0

## 2016-02-12 MED FILL — DESVENLAFAXINE ER 50 MG TAB: 50 | 90 days supply | Qty: 90 | Fill #1

## 2016-02-15 MED FILL — clonazePAM 0.5 MG TABS: 0.5 | 30 days supply | Qty: 60 | Fill #1

## 2016-03-10 MED FILL — BUPROPION HCL XL 150 MG TAB: 150 | 30 days supply | Qty: 30 | Fill #1

## 2016-03-10 MED FILL — ESTRADIOL 0.1 MG PATCH: 0.1 | 28 days supply | Qty: 8 | Fill #1

## 2016-03-18 MED FILL — clonazePAM 0.5 MG TABS: 0.5 | 30 days supply | Qty: 60 | Fill #2

## 2016-03-25 ENCOUNTER — Telehealth: Payer: Self-pay | Admitting: Internal Medicine

## 2016-03-25 DIAGNOSIS — R05 Cough: Secondary | ICD-10-CM

## 2016-03-25 DIAGNOSIS — R059 Cough, unspecified: Secondary | ICD-10-CM

## 2016-03-25 NOTE — Telephone Encounter (Signed)
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 She called me 03/25/2016 8:53 AM saying she did a test sprimetry recently and was abnormal. She had cornoavirus in 2013. She is asymptomatic. But coworker has chronic cough. Also has family hx of lung disese  So 1) do PFT at [facility] - she works there 2) to see me as new consult in 4-6 weeks    Dr. Brand Males, M.D., F.C.C.P Pulmonary and Critical Care Medicine Staff Physician [place] [facility] Pager: [phone], If no answer or between  15:00h - 7:00h: call [phone]  03/25/2016 8:55 AM

## 2016-03-26 NOTE — Telephone Encounter (Signed)
lmtcb x1 for pt. 

## 2016-03-26 NOTE — Telephone Encounter (Signed)
She said she is ok waiting because she is asymptomatic. However, you can get PFT at [facility] asap and I can review it. You can tell her first avail is august and I Think she shopuld be ok with it but if PFT bad I can work her in sooner

## 2016-03-26 NOTE — Telephone Encounter (Signed)
MR- your next available is 8/16 for a consult.  Ok to wait that long?

## 2016-03-26 NOTE — Telephone Encounter (Signed)
Pt returning call [phone]

## 2016-03-27 NOTE — Telephone Encounter (Signed)
Pt calling again aBOUT PFT.Megan Martin

## 2016-03-27 NOTE — Telephone Encounter (Signed)
Pt returning call.Megan Martin ° °

## 2016-03-27 NOTE — Telephone Encounter (Signed)
Spoke with pt and gave MR's recommendations. She would like to go ahead and have PFT done so that has been ordered. Appt scheduled for MR's first available on 06/04/16. Pt aware. Advised we will call with PFT results. Nothing further needed.

## 2016-04-03 ENCOUNTER — Ambulatory Visit (HOSPITAL_COMMUNITY)
Admission: RE | Admit: 2016-04-03 | Discharge: 2016-04-03 | Disposition: A | Discharge status: Home / Self Care | Payer: 59 | Source: Ambulatory Visit | Attending: Internal Medicine | Admitting: Internal Medicine

## 2016-04-03 DIAGNOSIS — R059 Cough, unspecified: Secondary | ICD-10-CM

## 2016-04-03 DIAGNOSIS — R05 Cough: Secondary | ICD-10-CM | POA: Insufficient documentation

## 2016-04-03 LAB — PULMONARY FUNCTION TEST
DL/VA % pred: 123 %
DL/VA: 5.43 ml/min/mmHg/L
DLCO unc % pred: 110 %
DLCO unc: 22.33 ml/min/mmHg
FEF 25-75 Post: 0.84 L/sec
FEF 25-75 Pre: 1.49 L/sec
FEF2575-%Change-Post: -43 %
FEF2575-%Pred-Post: 34 %
FEF2575-%Pred-Pre: 61 %
FEV1-%Change-Post: -10 %
FEV1-%Pred-Post: 77 %
FEV1-%Pred-Pre: 87 %
FEV1-Post: 1.89 L
FEV1-Pre: 2.12 L
FEV1FVC-%Change-Post: 2 %
FEV1FVC-%Pred-Pre: 91 %
FEV6-%Change-Post: -11 %
FEV6-%Pred-Post: 84 %
FEV6-%Pred-Pre: 95 %
FEV6-Post: 2.54 L
FEV6-Pre: 2.87 L
FEV6FVC-%Change-Post: 1 %
FEV6FVC-%Pred-Post: 103 %
FEV6FVC-%Pred-Pre: 101 %
FVC-%Change-Post: -12 %
FVC-%Pred-Post: 82 %
FVC-%Pred-Pre: 93 %
FVC-Post: 2.54 L
FVC-Pre: 2.91 L
Post FEV1/FVC ratio: 74 %
Post FEV6/FVC ratio: 100 %
Pre FEV1/FVC ratio: 73 %
Pre FEV6/FVC Ratio: 99 %
RV % pred: 34 %
RV: 0.59 L
TLC % pred: 107 %
TLC: 4.96 L

## 2016-04-03 MED ORDER — ALBUTEROL SULFATE (2.5 MG/3ML) 0.083% IN NEBU
2.5000 mg | INHALATION_SOLUTION | Freq: Once | RESPIRATORY_TRACT | Status: AC
  Administered 2016-04-03: 2.5 mg via RESPIRATORY_TRACT

## 2016-04-10 ENCOUNTER — Telehealth: Payer: Self-pay | Admitting: Internal Medicine

## 2016-04-10 NOTE — Telephone Encounter (Signed)
PFt 04/03/16 normal. 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

## 2016-04-10 NOTE — Telephone Encounter (Signed)
Called and spoke with patient, she said that she will call us first thing in the morning to see if she can change her appointment to tomorrow.  She said that her supervisor has left for the day already, so she will need to check with the supervisor to see if she can get off to come to appointment.  Will call in morning to schedule CONSULT for cough with Dr. Chase Caller.   HOLD in triage until patient calls back.

## 2016-04-11 NOTE — Telephone Encounter (Signed)
Patient decided to wait until August appointment.   Nothing further needed.

## 2016-04-15 MED FILL — BUPROPION HCL XL 150 MG TAB: 150 | 30 days supply | Qty: 30 | Fill #2

## 2016-04-15 MED FILL — ESTRADIOL 0.1 MG PATCH: 0.1 | 28 days supply | Qty: 8 | Fill #2

## 2016-04-15 MED FILL — clonazePAM 0.5 MG TABS: 0.5 | 30 days supply | Qty: 60 | Fill #3

## 2016-05-09 MED FILL — DESVENLAFAXINE ER 50 MG TAB: 50 | 90 days supply | Qty: 90 | Fill #0

## 2016-05-14 MED FILL — clonazePAM 0.5 MG TABS: 0.5 | 30 days supply | Qty: 60 | Fill #4

## 2016-05-14 MED FILL — BUPROPION HCL XL 150 MG TAB: 150 | 30 days supply | Qty: 30 | Fill #3

## 2016-05-16 DIAGNOSIS — Z0000 Encounter for general adult medical examination without abnormal findings: Secondary | ICD-10-CM | POA: Diagnosis not present

## 2016-05-16 DIAGNOSIS — M859 Disorder of bone density and structure, unspecified: Secondary | ICD-10-CM | POA: Diagnosis not present

## 2016-05-22 DIAGNOSIS — Z86010 Personal history of colonic polyps: Secondary | ICD-10-CM | POA: Diagnosis not present

## 2016-05-22 DIAGNOSIS — Z8249 Family history of ischemic heart disease and other diseases of the circulatory system: Secondary | ICD-10-CM | POA: Diagnosis not present

## 2016-05-22 DIAGNOSIS — F325 Major depressive disorder, single episode, in full remission: Secondary | ICD-10-CM | POA: Diagnosis not present

## 2016-05-22 DIAGNOSIS — K8020 Calculus of gallbladder without cholecystitis without obstruction: Secondary | ICD-10-CM | POA: Diagnosis not present

## 2016-05-22 DIAGNOSIS — Z1389 Encounter for screening for other disorder: Secondary | ICD-10-CM | POA: Diagnosis not present

## 2016-05-22 DIAGNOSIS — G4700 Insomnia, unspecified: Secondary | ICD-10-CM | POA: Diagnosis not present

## 2016-05-22 DIAGNOSIS — M859 Disorder of bone density and structure, unspecified: Secondary | ICD-10-CM | POA: Diagnosis not present

## 2016-05-22 DIAGNOSIS — E784 Other hyperlipidemia: Secondary | ICD-10-CM | POA: Diagnosis not present

## 2016-05-22 DIAGNOSIS — Z0000 Encounter for general adult medical examination without abnormal findings: Secondary | ICD-10-CM | POA: Diagnosis not present

## 2016-06-04 ENCOUNTER — Telehealth: Payer: Self-pay | Admitting: Internal Medicine

## 2016-06-04 ENCOUNTER — Ambulatory Visit (INDEPENDENT_AMBULATORY_CARE_PROVIDER_SITE_OTHER): Payer: 59 | Admitting: Internal Medicine

## 2016-06-04 ENCOUNTER — Encounter: Payer: Self-pay | Admitting: Internal Medicine

## 2016-06-04 ENCOUNTER — Ambulatory Visit (INDEPENDENT_AMBULATORY_CARE_PROVIDER_SITE_OTHER)
Admission: RE | Admit: 2016-06-04 | Discharge: 2016-06-04 | Disposition: A | Discharge status: Home / Self Care | Payer: 59 | Source: Ambulatory Visit | Attending: Internal Medicine | Admitting: Internal Medicine

## 2016-06-04 ENCOUNTER — Other Ambulatory Visit: Payer: 59

## 2016-06-04 VITALS — BP 110/68 | HR 85 | Ht 62.0 in | Wt 138.0 lb

## 2016-06-04 DIAGNOSIS — R0600 Dyspnea, unspecified: Secondary | ICD-10-CM | POA: Diagnosis not present

## 2016-06-04 DIAGNOSIS — Z825 Family history of asthma and other chronic lower respiratory diseases: Secondary | ICD-10-CM | POA: Diagnosis not present

## 2016-06-04 DIAGNOSIS — R0602 Shortness of breath: Secondary | ICD-10-CM | POA: Diagnosis not present

## 2016-06-04 NOTE — Telephone Encounter (Signed)
Megan Martin  pls order cpst bike test with EIB challenge with kristin thomas for dyspnea and then have [name] return some < 2 weeks after the test  Dr. Brand Males, M.D., [facility].C.P Pulmonary and Critical Care Medicine Staff Physician [place] [facility] Pager: [phone], If no answer or between  15:00h - 7:00h: call [phone]  06/04/2016 6:20 PM

## 2016-06-04 NOTE — Patient Instructions (Signed)
ICD-9-CM ICD-10-CM   1. Dyspnea 786.09 R06.00   2. Family history of COPD (chronic obstructive pulmonary disease) V17.6 Z82.5    Do CXR 2 view - if normal, then will order CPST bike test Do alpha 1 phenotype  followup  Based on test results

## 2016-06-04 NOTE — Progress Notes (Signed)
Subjective:    Patient ID: Megan Martin, female    DOB: 1960-11-15, 55 y.o.   MRN: [id number]  PCP Marton Redwood, MD   HPI  IOV 06/04/2016  Chief Complaint  Patient presents with  . Pulmonary Consult    Pt self referred for abnormal PFT. pt states she only has dyspnes with heavy exeriton. Pt denies cough and CP/tightness.     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 She is here because of 2 main questions  #1. She seems a strong family history of COPD. She says that her dad died last year from lung cancer but also had COPD. Her mom also had COPD. Her brother who is just 11 months apart from her also passed away within the last year and in his autopsy was found to have right coronary artery occlusion and also suffered COPD. Therefore she is worried that she might have COPD and she wants to be proactive about her health  #2 dyspnea. Dyspnea is of insidious onset. It is present on exertion while she exercises. She admits that she is not been exercising regularly but earlier this year she started exercising on a regular basis and despite that and improving her physical conditioning dyspnea remained at even moderate intensity for the exertion and relieved by rest. In the context of above she is worried about her pulmonary health. There is no associated wheezing or chest pain or cough. She is due to have screening coronary artery calcium CT scan shortly although I do not know which exact dat (this is because of high LDL history in her and the family history] . Off note in 2013 showed corona virus respiratory infection and had pulmonary edema and a chest x-ray which I personally visualized. Today the chest x-ray is clear. She had vomited function test 04/03/2016 and this is normal   Dg Chest 2 View  Result Date: 06/04/2016 CLINICAL DATA:  Shortness of breath on exertion EXAM: CHEST  2 VIEW COMPARISON:  01/20/2012 FINDINGS: The heart  size and mediastinal contours are within normal limits. Both lungs are clear. The visualized skeletal structures are unremarkable. IMPRESSION: No active cardiopulmonary disease. Electronically Signed   By: Lahoma Crocker M.D.   On: 06/04/2016 13:20       has a past medical history of Anemia; Chronic kidney disease; Depression; Insomnia; Neuromuscular disorder ([place]); S/P partial hysterectomy; and UTI (lower urinary tract infection).   reports that she has never smoked. She has never used smokeless tobacco.  Past Surgical History:  Procedure Laterality Date  . BLEPHAROPLASTY     bil lower  . CESAREAN SECTION  1982, 1984  . LAPAROSCOPIC HYSTERECTOMY     still has cervix, ovaries, 1 tube  . MYOMECTOMY    . sinus irrigations     two times /1968  . TONSILLECTOMY    . WISDOM TOOTH EXTRACTION      Allergies  Allergen Reactions  . Demerol Other (See Comments)    Swelling of arm  . Reglan [Metoclopramide]     Agitation and anxiousness    Immunization History  Administered Date(s) Administered  . Influenza,inj,Quad PF,36+ Mos 07/21/2015    Family History  Problem Relation Age of Onset  . Heart disease Mother   . Hypertension Mother   . Heart disease Father   . Early death Father   . Hypertension Brother   . Hyperlipidemia Brother   . Heart disease Brother   .  Colon cancer Paternal Uncle      Current Outpatient Prescriptions:  .  calcium-vitamin D (OSCAL WITH D) 500-200 MG-UNIT per tablet, Take 1 tablet by mouth daily., Disp: , Rfl:  .  cholecalciferol (VITAMIN D) 1000 UNITS tablet, Take by mouth daily. Take 5000 units daily, Disp: , Rfl:  .  clonazePAM (KLONOPIN) 0.5 MG tablet, Take 0.5 mg by mouth at bedtime., Disp: , Rfl:  .  estradiol (VIVELLE-DOT) 0.1 MG/24HR, Place 1 patch onto the skin 2 (two) times a week.  , Disp: , Rfl:  .  PRISTIQ 50 MG 24 hr tablet, TAKE 1 TABLET BY MOUTH DAILY (Patient taking differently: TAKE 1 TABLET BY MOUTH AT BEDTIME), Disp: 30 tablet, Rfl:  3    Review of Systems  Constitutional: Negative for fever and unexpected weight change.  HENT: Negative for congestion, dental problem, ear pain, nosebleeds, postnasal drip, rhinorrhea, sinus pressure, sneezing, sore throat and trouble swallowing.   Eyes: Negative for redness and itching.  Respiratory: Negative for cough, chest tightness, shortness of breath and wheezing.   Cardiovascular: Negative for palpitations and leg swelling.  Gastrointestinal: Negative for nausea and vomiting.  Genitourinary: Negative for dysuria.  Musculoskeletal: Negative for joint swelling.  Skin: Negative for rash.  Neurological: Negative for headaches.  Hematological: Does not bruise/bleed easily.  Psychiatric/Behavioral: Negative for dysphoric mood. The patient is not nervous/anxious.        Objective:   Physical Exam  Constitutional: She is oriented to person, place, and time. She appears well-developed and well-nourished. No distress.  HENT:  Head: Normocephalic and atraumatic.  Right Ear: External ear normal.  Left Ear: External ear normal.  Mouth/Throat: Oropharynx is clear and moist. No oropharyngeal exudate.  Eyes: Conjunctivae and EOM are normal. Pupils are equal, round, and reactive to light. Right eye exhibits no discharge. Left eye exhibits no discharge. No scleral icterus.  Neck: Normal range of motion. Neck supple. No JVD present. No tracheal deviation present. No thyromegaly present.  Cardiovascular: Normal rate, regular rhythm, normal heart sounds and intact distal pulses.  Exam reveals no gallop and no friction rub.   No murmur heard. Pulmonary/Chest: Effort normal and breath sounds normal. No respiratory distress. She has no wheezes. She has no rales. She exhibits no tenderness.  Abdominal: Soft. Bowel sounds are normal. She exhibits no distension and no mass. There is no tenderness. There is no rebound and no guarding.  Musculoskeletal: Normal range of motion. She exhibits no edema or  tenderness.  Lymphadenopathy:    She has no cervical adenopathy.  Neurological: She is alert and oriented to person, place, and time. She has normal reflexes. No cranial nerve deficit. She exhibits normal muscle tone. Coordination normal.  Skin: Skin is warm and dry. No rash noted. She is not diaphoretic. No erythema. No pallor.  Psychiatric: She has a normal mood and affect. Her behavior is normal. Judgment and thought content normal.  Vitals reviewed.   Vitals:   06/04/16 1137  BP: 110/68  Pulse: 85  SpO2: 99%  Weight: 138 lb (62.6 kg)  Height: 5\' 2"  (1.575 m)   Estimated body mass index is 25.24 kg/m as calculated from the following:   Height as of this encounter: 5\' 2"  (1.575 m).   Weight as of this encounter: 138 lb (62.6 kg).       Assessment & Plan:     ICD-9-CM ICD-10-CM   1. Dyspnea 786.09 R06.00 Alpha-1 antitrypsin phenotype     DG Chest 2 View  2.  Family history of COPD (chronic obstructive pulmonary disease) V17.6 Z82.5 Alpha-1 antitrypsin phenotype     DG Chest 2 View   Due to the strong family history of COPD despite her being a nonsmoker: We will get alpha 1 antitrypsin genetic test. I've counseled her about this test and she has verbalized understanding  In terms of dyspnea: I suspect this is due to physical deconditioning but given the coronary artery disease history in her family and her desire to be proactive about her health in the setting of normal chest x-ray , And normal pulmonary function test static we will proceed with a pulmonary stress test on the bike with excess induced bronchospasm challenge    Dr. Brand Males, M.D., [facility].C.P Pulmonary and Critical Care Medicine Staff Physician [place] [facility] Pager: [phone], If no answer or between  15:00h - 7:00h: call [phone]  06/04/2016 6:18 PM

## 2016-06-05 NOTE — Telephone Encounter (Signed)
lmtcb for pt.  

## 2016-06-06 NOTE — Telephone Encounter (Signed)
CPX has been scheduled for 8/23.  I would have made her a follow up appt but his next opening after that is 9/13 & he said to make ov less than 2 weeks later.  Didn't know where to put her??  I called & gave her CPX appt info.

## 2016-06-06 NOTE — Telephone Encounter (Signed)
Called and spoke to pt. Informed her of the recs per MR. Order placed.   PCC's please advise when CPST has been made for OV can be made. Thanks.

## 2016-06-09 LAB — ALPHA-1 ANTITRYPSIN PHENOTYPE: A-1 Antitrypsin: 128 mg/dL (ref 83–199)

## 2016-06-09 NOTE — Telephone Encounter (Signed)
LMOMTCB x 1 

## 2016-06-09 NOTE — Telephone Encounter (Signed)
MR please advise, if ok to put pt on TP's schedule as you do not have any openings until 9/13. Thanks.

## 2016-06-09 NOTE — Telephone Encounter (Signed)
[name] is in no rush and she prefers to see me. SO please have her visit with me 07/02/16  THanks  Dr. Brand Males, M.D., F.C.C.P Pulmonary and Critical Care Medicine Staff Physician [place] [facility] Pager: [phone], If no answer or between  15:00h - 7:00h: call [phone]  06/09/2016 2:52 PM

## 2016-06-10 NOTE — Telephone Encounter (Signed)
Pt cb, f/u appt scheduled for 07/04/2016 @ 12pm with MR, nothing further needed

## 2016-06-11 ENCOUNTER — Encounter (HOSPITAL_COMMUNITY): Payer: 59

## 2016-06-12 NOTE — Progress Notes (Signed)
lmtcb

## 2016-06-13 NOTE — Progress Notes (Signed)
lmtcb for pt.  

## 2016-06-16 MED FILL — BUPROPION HCL XL 150 MG TAB: 150 | 30 days supply | Qty: 30 | Fill #4

## 2016-06-16 MED FILL — clonazePAM 0.5 MG TABS: 0.5 | 30 days supply | Qty: 60 | Fill #0

## 2016-06-17 ENCOUNTER — Encounter: Payer: Self-pay | Admitting: Emergency Medicine

## 2016-06-17 NOTE — Progress Notes (Signed)
LMCTB for pt. Because of the unsuccessful attempts to the reach the pt a letter has been mailed to pt's home address informing her to contact us back regarding results. Will sign off.

## 2016-06-18 MED FILL — ESTRADIOL 0.1 MG PATCH: 0.1 | 84 days supply | Qty: 24 | Fill #3

## 2016-06-25 ENCOUNTER — Encounter (HOSPITAL_COMMUNITY): Payer: 59

## 2016-07-02 ENCOUNTER — Telehealth (HOSPITAL_COMMUNITY): Payer: Self-pay | Admitting: Vascular Surgery

## 2016-07-02 ENCOUNTER — Ambulatory Visit (HOSPITAL_COMMUNITY): Payer: 59 | Attending: Internal Medicine

## 2016-07-02 DIAGNOSIS — R0600 Dyspnea, unspecified: Secondary | ICD-10-CM | POA: Insufficient documentation

## 2016-07-02 NOTE — Telephone Encounter (Signed)
Left pt message . Megan Martin has car trouble calling to get pt resc for later time today

## 2016-07-04 ENCOUNTER — Ambulatory Visit (INDEPENDENT_AMBULATORY_CARE_PROVIDER_SITE_OTHER): Payer: 59 | Admitting: Internal Medicine

## 2016-07-04 ENCOUNTER — Encounter: Payer: Self-pay | Admitting: Internal Medicine

## 2016-07-04 VITALS — BP 102/64 | HR 80 | Ht 62.0 in | Wt 135.0 lb

## 2016-07-04 DIAGNOSIS — R0600 Dyspnea, unspecified: Secondary | ICD-10-CM

## 2016-07-04 NOTE — Progress Notes (Signed)
PCP Marton Redwood, MD   HPI  IOV 06/04/2016  Chief Complaint  Patient presents with  . Pulmonary Consult    Pt self referred for abnormal PFT. pt states she only has dyspnes with heavy exeriton. Pt denies cough and CP/tightness.     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 She is here because of 2 main questions  #1. She seems a strong family history of COPD. She says that her dad died last year from lung cancer but also had COPD. Her mom also had COPD. Her brother who is just 11 months apart from her also passed away within the last year and in his autopsy was found to have right coronary artery occlusion and also suffered COPD. Therefore she is worried that she might have COPD and she wants to be proactive about her health #2 dyspnea. Dyspnea is of insidious onset. It is present on exertion while she exercises. She admits that she is not been exercising regularly but earlier this year she started exercising on a regular basis and despite that and improving her physical conditioning dyspnea remained at even moderate intensity for the exertion and relieved by rest. In the context of above she is worried about her pulmonary health. There is no associated wheezing or chest pain or cough. She is due to have screening coronary artery calcium CT scan shortly although I do not know which exact dat (this is because of high LDL history in her and the family history] . Off note in 2013 showed corona virus respiratory infection and had pulmonary edema and a chest x-ray which I personally visualized. Today the chest x-ray is clear. She had vomited function test 04/03/2016 and this is normal Subjective:     Patient ID: Megan Martin, female   DOB: [birth year], 55 y.o.   MRN: [id number]  HPI    OV 07/04/2016  Chief Complaint  Patient presents with  . Follow-up    Pt here after CPST. Pt denies any changes in breathing since last OV on 06/04/16.  Pt denies cough and CP/tightness.      Follow-up dyspnea. Here to review CPST results done 07/03/2016. Results are still preliminary. She gave a good effort RER was 1.35 she stopped due to dyspnea. Heart rate exceeded predicted maximum heart rate. Blood pressure response and EKG response was adequate. No exercise-induced bronchospasm. VO2 max was 98%. O2 pulse was 78% respiratory parameters were normal. Alpha-1 antitrypsin was normal. She still has dyspnea with exercise.    has a past medical history of Anemia; Chronic kidney disease; Depression; Insomnia; Neuromuscular disorder ([place]); S/P partial hysterectomy; and UTI (lower urinary tract infection).   reports that she has never smoked. She has never used smokeless tobacco.  Past Surgical History:  Procedure Laterality Date  . BLEPHAROPLASTY     bil lower  . CESAREAN SECTION  1982, 1984  . LAPAROSCOPIC HYSTERECTOMY     still has cervix, ovaries, 1 tube  . MYOMECTOMY    . sinus irrigations     two times /1968  . TONSILLECTOMY    . WISDOM TOOTH EXTRACTION      Allergies  Allergen Reactions  . Demerol Other (See Comments)    Swelling of arm  . Reglan [Metoclopramide]     Agitation and anxiousness    Immunization History  Administered Date(s) Administered  . Influenza,inj,Quad PF,36+ Mos 07/21/2015    Family History  Problem Relation Age of Onset  .  Heart disease Mother   . Hypertension Mother   . Heart disease Father   . Early death Father   . Hypertension Brother   . Hyperlipidemia Brother   . Heart disease Brother   . Colon cancer Paternal Uncle      Current Outpatient Prescriptions:  .  calcium-vitamin D (OSCAL WITH D) 500-200 MG-UNIT per tablet, Take 1 tablet by mouth daily., Disp: , Rfl:  .  cholecalciferol (VITAMIN D) 1000 UNITS tablet, Take by mouth daily. Take 5000 units daily, Disp: , Rfl:  .  clonazePAM (KLONOPIN) 0.5 MG tablet, Take 0.5 mg by mouth at bedtime., Disp: , Rfl:  .  estradiol (VIVELLE-DOT)  0.1 MG/24HR, Place 1 patch onto the skin 2 (two) times a week.  , Disp: , Rfl:  .  PRISTIQ 50 MG 24 hr tablet, TAKE 1 TABLET BY MOUTH DAILY (Patient taking differently: TAKE 1 TABLET BY MOUTH AT BEDTIME), Disp: 30 tablet, Rfl: 3   Review of Systems     Objective:   Physical Exam  Vitals:   07/04/16 1202  BP: 102/64  Pulse: 80  SpO2: 98%  Weight: 135 lb (61.2 kg)  Height: 5\' 2"  (1.575 m)    Discussion only visit     Assessment:       ICD-9-CM ICD-10-CM   1. Dyspnea 786.09 R06.00        Plan:     No limitttion but due to borderline low stroke volume index in otherwise healthy female with family hx of cad will get echo   Dr. Brand Males, M.D., [facility].C.P Pulmonary and Critical Care Medicine Staff Physician [place] [facility] Pager: [phone], If no answer or between  15:00h - 7:00h: call [phone]  07/07/2016 10:37 AM

## 2016-07-04 NOTE — Patient Instructions (Addendum)
ICD-9-CM ICD-10-CM   1. Dyspnea 786.09 R06.00    Get echo

## 2016-07-07 ENCOUNTER — Telehealth: Payer: Self-pay | Admitting: Internal Medicine

## 2016-07-07 DIAGNOSIS — R0600 Dyspnea, unspecified: Secondary | ICD-10-CM | POA: Diagnosis not present

## 2016-07-07 NOTE — Telephone Encounter (Signed)
LMTCB

## 2016-07-07 NOTE — Telephone Encounter (Signed)
LEt Megan Martin know that I have not heard back from Dr Jeffie Pollock and so to be on safe side due to her family hx - get 2d resting echo for dyspnea eval. We will call her with results  Dr. Brand Males, M.D., [facility].C.P Pulmonary and Critical Care Medicine Staff Physician [place] [facility] Pager: [phone], If no answer or between  15:00h - 7:00h: call [phone]  07/07/2016 10:40 AM

## 2016-07-07 NOTE — Telephone Encounter (Signed)
Pt returning call - can be reached at [phone]

## 2016-07-17 MED FILL — BUPROPION HCL XL 150 MG TAB: 150 | 30 days supply | Qty: 30 | Fill #5

## 2016-07-17 MED FILL — clonazePAM 0.5 MG TABS: 0.5 | 30 days supply | Qty: 60 | Fill #1

## 2016-08-04 DIAGNOSIS — H524 Presbyopia: Secondary | ICD-10-CM | POA: Diagnosis not present

## 2016-08-04 DIAGNOSIS — H52223 Regular astigmatism, bilateral: Secondary | ICD-10-CM | POA: Diagnosis not present

## 2016-08-04 DIAGNOSIS — H5213 Myopia, bilateral: Secondary | ICD-10-CM | POA: Diagnosis not present

## 2016-08-04 DIAGNOSIS — H2513 Age-related nuclear cataract, bilateral: Secondary | ICD-10-CM | POA: Diagnosis not present

## 2016-08-13 MED FILL — DESVENLAFAXINE SUC ER 50 MG: 50 | 90 days supply | Qty: 90 | Fill #0

## 2016-08-13 MED FILL — BUPROPION HCL XL 150 MG TAB: 150 | 30 days supply | Qty: 30 | Fill #6

## 2016-08-14 MED FILL — clonazePAM 0.5 MG TABS: 0.5 | 30 days supply | Qty: 60 | Fill #2

## 2016-08-22 DIAGNOSIS — Z1231 Encounter for screening mammogram for malignant neoplasm of breast: Secondary | ICD-10-CM | POA: Diagnosis not present

## 2016-08-22 DIAGNOSIS — Z6825 Body mass index (BMI) 25.0-25.9, adult: Secondary | ICD-10-CM | POA: Diagnosis not present

## 2016-08-22 DIAGNOSIS — Z01419 Encounter for gynecological examination (general) (routine) without abnormal findings: Secondary | ICD-10-CM | POA: Diagnosis not present

## 2016-08-27 MED FILL — FLUCONAZOLE 150 MG TABLET: 150 | 1 days supply | Qty: 1 | Fill #0

## 2016-09-16 MED FILL — BUPROPION HCL XL 150 MG TAB: 150 | 30 days supply | Qty: 30 | Fill #0

## 2016-09-16 MED FILL — clonazePAM 0.5 MG TABS: 0.5 | 30 days supply | Qty: 60 | Fill #3

## 2016-10-17 MED FILL — ESTRADIOL 0.1 MG PATCH: 0.1 | 27 days supply | Qty: 8 | Fill #4

## 2016-10-17 MED FILL — BUPROPION HCL XL 150 MG TAB: 150 | 30 days supply | Qty: 30 | Fill #1

## 2016-10-17 MED FILL — clonazePAM 0.5 MG TABS: 0.5 | 30 days supply | Qty: 60 | Fill #4

## 2016-11-10 MED FILL — DESVENLAFAXINE SUC ER 50 MG: 50 | 90 days supply | Qty: 90 | Fill #1

## 2016-11-10 MED FILL — BUPROPION HCL XL 150 MG TAB: 150 | 30 days supply | Qty: 30 | Fill #2

## 2016-11-17 MED FILL — clonazePAM 0.5 MG TABS: 0.5 | 30 days supply | Qty: 60 | Fill #5

## 2016-12-16 MED FILL — BUPROPION HCL XL 150 MG TAB: 150 | 30 days supply | Qty: 30 | Fill #3

## 2016-12-16 MED FILL — clonazePAM 0.5 MG TABS: 0.5 | 30 days supply | Qty: 60 | Fill #0

## 2016-12-30 ENCOUNTER — Encounter (HOSPITAL_BASED_OUTPATIENT_CLINIC_OR_DEPARTMENT_OTHER): Payer: Self-pay

## 2016-12-30 ENCOUNTER — Emergency Department (HOSPITAL_BASED_OUTPATIENT_CLINIC_OR_DEPARTMENT_OTHER)
Admission: EM | Admit: 2016-12-30 | Discharge: 2016-12-31 | Disposition: A | Discharge status: Home / Self Care | Payer: 59 | Attending: Emergency Medicine | Admitting: Emergency Medicine

## 2016-12-30 DIAGNOSIS — R1110 Vomiting, unspecified: Secondary | ICD-10-CM

## 2016-12-30 DIAGNOSIS — G44039 Episodic paroxysmal hemicrania, not intractable: Secondary | ICD-10-CM | POA: Insufficient documentation

## 2016-12-30 DIAGNOSIS — Y69 Unspecified misadventure during surgical and medical care: Secondary | ICD-10-CM | POA: Diagnosis not present

## 2016-12-30 DIAGNOSIS — T450X5A Adverse effect of antiallergic and antiemetic drugs, initial encounter: Secondary | ICD-10-CM | POA: Diagnosis not present

## 2016-12-30 DIAGNOSIS — T50905A Adverse effect of unspecified drugs, medicaments and biological substances, initial encounter: Secondary | ICD-10-CM

## 2016-12-30 DIAGNOSIS — R112 Nausea with vomiting, unspecified: Secondary | ICD-10-CM | POA: Insufficient documentation

## 2016-12-30 DIAGNOSIS — T50995A Adverse effect of other drugs, medicaments and biological substances, initial encounter: Secondary | ICD-10-CM | POA: Diagnosis not present

## 2016-12-30 HISTORY — DX: Adverse effect of unspecified anesthetic, initial encounter: T41.45XA

## 2016-12-30 HISTORY — DX: Other complications of anesthesia, initial encounter: T88.59XA

## 2016-12-30 LAB — URINALYSIS, ROUTINE W REFLEX MICROSCOPIC
Bilirubin Urine: NEGATIVE
Glucose, UA: NEGATIVE mg/dL
Hgb urine dipstick: NEGATIVE
Ketones, ur: NEGATIVE mg/dL
Leukocytes, UA: NEGATIVE
Nitrite: NEGATIVE
Protein, ur: NEGATIVE mg/dL
Specific Gravity, Urine: 1.025 (ref 1.005–1.030)
pH: 7 (ref 5.0–8.0)

## 2016-12-30 MED ORDER — ONDANSETRON HCL 4 MG/2ML IJ SOLN: 4.0000 mg | Freq: Once | INTRAMUSCULAR | Status: DC | PRN

## 2016-12-30 NOTE — ED Triage Notes (Signed)
Pt had dental surgery yesterday and per husband does not do well with anesthesia, has had n/v and headache since night.  Pt has taken zofran today, last dose was at 1800.  Pt states she has vomited 7 times.

## 2016-12-31 ENCOUNTER — Encounter (HOSPITAL_BASED_OUTPATIENT_CLINIC_OR_DEPARTMENT_OTHER): Payer: Self-pay | Admitting: Emergency Medicine

## 2016-12-31 DIAGNOSIS — R112 Nausea with vomiting, unspecified: Secondary | ICD-10-CM | POA: Diagnosis not present

## 2016-12-31 DIAGNOSIS — T450X5A Adverse effect of antiallergic and antiemetic drugs, initial encounter: Secondary | ICD-10-CM | POA: Diagnosis not present

## 2016-12-31 DIAGNOSIS — G44039 Episodic paroxysmal hemicrania, not intractable: Secondary | ICD-10-CM | POA: Diagnosis not present

## 2016-12-31 MED ORDER — KETOROLAC TROMETHAMINE 30 MG/ML IJ SOLN
30.0000 mg | Freq: Once | INTRAMUSCULAR | Status: AC
  Administered 2016-12-31: 30 mg via INTRAVENOUS
  Filled 2016-12-31: qty 1

## 2016-12-31 MED ORDER — DIVALPROEX SODIUM 500 MG PO DR TAB
500.0000 mg | DELAYED_RELEASE_TABLET | Freq: Once | ORAL | Status: DC
  Filled 2016-12-31: qty 1

## 2016-12-31 MED ORDER — DIVALPROEX SODIUM ER 250 MG PO TB24
ORAL_TABLET | ORAL | Status: AC
  Administered 2016-12-31: 500 mg
  Filled 2016-12-31: qty 2

## 2016-12-31 MED ORDER — DEXAMETHASONE SODIUM PHOSPHATE 10 MG/ML IJ SOLN
10.0000 mg | Freq: Once | INTRAMUSCULAR | Status: AC
  Administered 2016-12-31: 10 mg via INTRAVENOUS
  Filled 2016-12-31: qty 1

## 2016-12-31 MED ORDER — ONDANSETRON HCL 4 MG/2ML IJ SOLN
4.0000 mg | Freq: Once | INTRAMUSCULAR | Status: AC
  Administered 2016-12-31: 4 mg via INTRAVENOUS
  Filled 2016-12-31: qty 2

## 2016-12-31 MED ORDER — ONDANSETRON 8 MG PO TBDP: ORAL_TABLET | ORAL | 0 refills | Status: DC

## 2016-12-31 MED ORDER — SODIUM CHLORIDE 0.9 % IV BOLUS (SEPSIS)
500.0000 mL | Freq: Once | INTRAVENOUS | Status: AC
  Administered 2016-12-31: 500 mL via INTRAVENOUS

## 2016-12-31 MED ORDER — NAPROXEN 375 MG PO TABS: 375.0000 mg | ORAL_TABLET | Freq: Two times a day (BID) | ORAL | 0 refills | Status: DC

## 2016-12-31 MED ORDER — MAGNESIUM SULFATE IN D5W 1-5 GM/100ML-% IV SOLN
1.0000 g | Freq: Once | INTRAVENOUS | Status: AC
Start: 2016-12-31 — End: 2016-12-31
  Administered 2016-12-31: 1 g via INTRAVENOUS
  Filled 2016-12-31: qty 100

## 2016-12-31 NOTE — ED Provider Notes (Signed)
[place] DEPT [facility] Provider Note   CSN: [id number] Arrival date & time: 12/30/16  2254     History   Chief Complaint Chief Complaint  Patient presents with  . Emesis    HPI Megan Martin is a 56 y.o. female.  The history is provided by the patient.  Emesis   This is a recurrent problem. The current episode started 6 to 12 hours ago. The problem occurs 5 to 10 times per day. The problem has not changed since onset.The emesis has an appearance of stomach contents. There has been no fever. Associated symptoms include headaches. Pertinent negatives include no abdominal pain, no arthralgias, no chills, no cough, no diarrhea, no fever, no myalgias, no sweats and no URI. Associated symptoms comments: Post sedation and dental procedure headache and vomiting patient gets this every time she gets sedation.  Nothing atypical.  No f/c/r. No focal neuro deficits . Risk factors: none.  Has been on narcotic pain medis for her teeth prior to today.  Also had laughing gas for tooth procedure and does not do well with this.  No focal neuro deficits.  No f/c/r.  No neck pain or stiffness.    Past Medical History:  Diagnosis Date  . Anemia   . Complication of anesthesia    Pt states she gets post-op N&V,dehydration,decreased B/P past sedation.She denies allergy to soy and eggs  . Depression   . Insomnia   . S/P partial hysterectomy   . UTI (lower urinary tract infection)     Patient Active Problem List   Diagnosis Date Noted  . Hypotension 01/21/2012    Class: Acute  . Sepsis(995.91) 01/18/2012  . Nausea and vomiting 01/18/2012  . Leukocytosis 01/18/2012  . Headache(784.0) 01/18/2012  . Iron deficiency anemia 01/12/2011  . Reactive depression (situational) 01/12/2011  . History of UTI 01/12/2011  . Insomnia 01/12/2011  . Restless leg syndrome 01/12/2011  . Hyperlipemia 01/12/2011    Past Surgical History:  Procedure Laterality Date  . ABDOMINAL HYSTERECTOMY    . BLEPHAROPLASTY      bil lower  . CESAREAN SECTION  1982, 1984  . LAPAROSCOPIC HYSTERECTOMY     still has cervix, ovaries, 1 tube  . MYOMECTOMY    . sinus irrigations     two times /1968  . TONSILLECTOMY    . WISDOM TOOTH EXTRACTION      OB History    No data available       Home Medications    Prior to Admission medications   Medication Sig Start Date End Date Taking? Authorizing Provider  calcium-vitamin D (OSCAL WITH D) 500-200 MG-UNIT per tablet Take 1 tablet by mouth daily.    Historical Provider, MD  cholecalciferol (VITAMIN D) 1000 UNITS tablet Take by mouth daily. Take 5000 units daily    Historical Provider, MD  clonazePAM (KLONOPIN) 0.5 MG tablet Take 0.5 mg by mouth at bedtime.    Historical Provider, MD  estradiol (VIVELLE-DOT) 0.1 MG/24HR Place 1 patch onto the skin 2 (two) times a week.      Historical Provider, MD  PRISTIQ 50 MG 24 hr tablet TAKE 1 TABLET BY MOUTH DAILY Patient taking differently: TAKE 1 TABLET BY MOUTH AT BEDTIME 08/21/11   Ivy [place], DO    Family History Family History  Problem Relation Age of Onset  . Heart disease Mother   . Hypertension Mother   . Heart disease Father   . Early death Father   . Hypertension Brother   .  Hyperlipidemia Brother   . Heart disease Brother   . Colon cancer Paternal Uncle     Social History Social History  Substance Use Topics  . Smoking status: Never Smoker  . Smokeless tobacco: Never Used  . Alcohol use No     Allergies   Demerol and Reglan [metoclopramide]   Review of Systems Review of Systems  Constitutional: Negative for chills and fever.  HENT: Negative for congestion, drooling and facial swelling.   Eyes: Negative for photophobia.  Respiratory: Negative for cough and shortness of breath.   Cardiovascular: Negative for chest pain, palpitations and leg swelling.  Gastrointestinal: Positive for nausea and vomiting. Negative for abdominal pain and diarrhea.  Musculoskeletal: Negative for arthralgias,  myalgias, neck pain and neck stiffness.  Skin: Negative for rash.  Neurological: Positive for headaches. Negative for dizziness, tremors, seizures, syncope, facial asymmetry, speech difficulty, weakness, light-headedness and numbness.  All other systems reviewed and are negative.    Physical Exam Updated Vital Signs BP 114/83 (BP Location: Right Arm)   Pulse 93   Temp 98.3 F (36.8 C) (Oral)   Resp 18   Ht 5\' 2"  (1.575 m)   Wt 135 lb (61.2 kg)   SpO2 99%   BMI 24.69 kg/m   Physical Exam  Constitutional: She is oriented to person, place, and time. She appears well-developed and well-nourished. No distress.  HENT:  Head: Normocephalic and atraumatic.  Right Ear: External ear normal.  Left Ear: External ear normal.  Mouth/Throat: Oropharynx is clear and moist. No oropharyngeal exudate.  Intact cognition no proptosis  Eyes: Conjunctivae and EOM are normal. Pupils are equal, round, and reactive to light.  Neck: Normal range of motion. Neck supple. No JVD present. No tracheal deviation present.  Cardiovascular: Normal rate, regular rhythm, normal heart sounds and intact distal pulses.   Pulmonary/Chest: Effort normal and breath sounds normal. No stridor. No respiratory distress. She has no wheezes. She has no rales.  Abdominal: Soft. Bowel sounds are normal. She exhibits no mass. There is no tenderness. There is no rebound and no guarding.  Musculoskeletal: Normal range of motion. She exhibits no deformity.  Lymphadenopathy:    She has no cervical adenopathy.  Neurological: She is alert and oriented to person, place, and time. She displays normal reflexes. No cranial nerve deficit.  Skin: Skin is warm and dry. Capillary refill takes less than 2 seconds.  Psychiatric: She has a normal mood and affect.     ED Treatments / Results   Vitals:   12/30/16 2303 12/31/16 0135  BP: 114/83 115/79  Pulse: 93 66  Resp: 18 18  Temp: 98.3 F (36.8 C)      Procedures Procedures  (including critical care time)  Medications Ordered in ED  Medications  divalproex (DEPAKOTE) DR tablet 500 mg (500 mg Oral Not Given 12/31/16 0204)  ketorolac (TORADOL) 30 MG/ML injection 30 mg (30 mg Intravenous Given 12/31/16 0040)  dexamethasone (DECADRON) injection 10 mg (10 mg Intravenous Given 12/31/16 0040)  ondansetron (ZOFRAN) injection 4 mg (4 mg Intravenous Given 12/31/16 0040)  sodium chloride 0.9 % bolus 500 mL (0 mLs Intravenous Stopped 12/31/16 0144)  magnesium sulfate IVPB 1 g 100 mL (1 g Intravenous New Bag/Given 12/31/16 0158)  divalproex (DEPAKOTE ER) 250 MG 24 hr tablet (500 mg  Given 12/31/16 0158)      Final Clinical Impressions(s) / ED Diagnoses  The headache is likely secondary to analgesia rebound from both the narcotic pain medication and the sedation.  Patient is markedly improved post medication.  She has had similar in the past.  Vomiting has ceased and patient PO challenged in the ED.  There is no f/c/r. No neck pain or stiffness.  No proptosis.  No focal neuro deficits.  Intact cognition.  There is no indication for CT or Lp in this patient.    Patient is well appearing, normal vital signs.    Based on history and exam patient has been appropriately medically screened and emergency conditions excluded. Patient is stable for discharge at this time. Strict return precautions given for fever, weakness, numbness changes in vision or speech, changes in cognition, neck pain or stiffness, shortness of breath, chest pain, leg pain or swelling, intractable vomiting, worsening symptomsor anyfurther problems or concerns.  Follow up with your PMD in 2 days for recheck.  New Prescriptions New Prescriptions   No medications on file     Kynslei Art, MD 12/31/16 [phone]

## 2017-01-12 MED FILL — BUPROPION HCL XL 150 MG TAB: 150 | 30 days supply | Qty: 30 | Fill #4

## 2017-01-13 MED FILL — clonazePAM 0.5 MG TABS: 0.5 | 30 days supply | Qty: 60 | Fill #1

## 2017-01-19 MED FILL — ESTRADIOL 0.1 MG PATCH: 0.1 | 28 days supply | Qty: 8 | Fill #0

## 2017-02-09 MED FILL — DESVENLAFAXINE SUC ER 50 MG: 50 | 90 days supply | Qty: 90 | Fill #2

## 2017-02-09 MED FILL — BUPROPION HCL XL 150 MG TAB: 150 | 30 days supply | Qty: 30 | Fill #5

## 2017-02-12 MED FILL — clonazePAM 0.5 MG TABS: 0.5 | 30 days supply | Qty: 60 | Fill #2

## 2017-02-12 MED FILL — ESTRADIOL 0.1 MG PATCH: 0.1 | 28 days supply | Qty: 8 | Fill #1

## 2017-03-17 MED FILL — clonazePAM 0.5 MG TABS: 0.5 | 30 days supply | Qty: 60 | Fill #3

## 2017-03-17 MED FILL — BUPROPION HCL XL 150 MG TAB: 150 | 30 days supply | Qty: 30 | Fill #6

## 2017-04-15 MED FILL — clonazePAM 0.5 MG TABS: 0.5 | 30 days supply | Qty: 60 | Fill #4

## 2017-04-15 MED FILL — BUPROPION HCL XL 150 MG TAB: 150 | 30 days supply | Qty: 30 | Fill #7

## 2017-04-21 MED FILL — ESTRADIOL 0.1 MG PATCH: 0.1 | 28 days supply | Qty: 8 | Fill #2

## 2017-05-06 MED FILL — DESVENLAFAXINE SUC ER 50 MG: 50 | 90 days supply | Qty: 90 | Fill #0

## 2017-05-14 MED FILL — BUPROPION HCL XL 150 MG TAB: 150 | 30 days supply | Qty: 30 | Fill #8

## 2017-05-14 MED FILL — clonazePAM 0.5 MG TABS: 0.5 | 30 days supply | Qty: 60 | Fill #5

## 2017-05-20 MED FILL — ESTRADIOL 0.1 MG PATCH: 0.1 | 28 days supply | Qty: 8 | Fill #3

## 2017-06-15 MED FILL — clonazePAM 0.5 MG TABS: 0.5 | 30 days supply | Qty: 60 | Fill #0

## 2017-06-15 MED FILL — buPROPion HCL ER (XL) 150 M: 150 | 30 days supply | Qty: 30 | Fill #9

## 2017-06-15 MED FILL — ESTRADIOL 0.1 MG PATCH: 0.1 | 28 days supply | Qty: 8 | Fill #4

## 2017-07-15 DIAGNOSIS — Z0000 Encounter for general adult medical examination without abnormal findings: Secondary | ICD-10-CM | POA: Diagnosis not present

## 2017-07-15 DIAGNOSIS — M859 Disorder of bone density and structure, unspecified: Secondary | ICD-10-CM | POA: Diagnosis not present

## 2017-07-15 MED FILL — ESTRADIOL 0.1 MG PATCH: 0.1 | 28 days supply | Qty: 8 | Fill #5

## 2017-07-15 MED FILL — buPROPion HCL ER (XL) 150 M: 150 | 30 days supply | Qty: 30 | Fill #10

## 2017-07-15 MED FILL — clonazePAM 0.5 MG TABS: 0.5 | 30 days supply | Qty: 60 | Fill #1

## 2017-07-22 DIAGNOSIS — Z86010 Personal history of colonic polyps: Secondary | ICD-10-CM | POA: Diagnosis not present

## 2017-07-22 DIAGNOSIS — E7849 Other hyperlipidemia: Secondary | ICD-10-CM | POA: Diagnosis not present

## 2017-07-22 DIAGNOSIS — F325 Major depressive disorder, single episode, in full remission: Secondary | ICD-10-CM | POA: Diagnosis not present

## 2017-07-22 DIAGNOSIS — Z0000 Encounter for general adult medical examination without abnormal findings: Secondary | ICD-10-CM | POA: Diagnosis not present

## 2017-07-22 DIAGNOSIS — Z8249 Family history of ischemic heart disease and other diseases of the circulatory system: Secondary | ICD-10-CM | POA: Diagnosis not present

## 2017-07-22 DIAGNOSIS — K5909 Other constipation: Secondary | ICD-10-CM | POA: Diagnosis not present

## 2017-07-22 DIAGNOSIS — M859 Disorder of bone density and structure, unspecified: Secondary | ICD-10-CM | POA: Diagnosis not present

## 2017-07-22 DIAGNOSIS — Z1389 Encounter for screening for other disorder: Secondary | ICD-10-CM | POA: Diagnosis not present

## 2017-07-22 DIAGNOSIS — G4700 Insomnia, unspecified: Secondary | ICD-10-CM | POA: Diagnosis not present

## 2017-07-22 DIAGNOSIS — G43909 Migraine, unspecified, not intractable, without status migrainosus: Secondary | ICD-10-CM | POA: Diagnosis not present

## 2017-07-22 MED FILL — SUMATRIPTAN SUCC 100 MG TAB: 100 | 30 days supply | Qty: 8 | Fill #0

## 2017-07-22 MED FILL — NITROFURANTOIN MONO-MCR 100: 100 | 90 days supply | Qty: 30 | Fill #0

## 2017-07-24 MED FILL — TOPIRAMATE 50 MG TABLET: 50 | 30 days supply | Qty: 30 | Fill #0

## 2017-08-07 MED FILL — DESVENLAFAXINE SUC ER 50 MG: 50 | 30 days supply | Qty: 30 | Fill #0

## 2017-08-13 MED FILL — clonazePAM 0.5 MG TABS: 0.5 | 30 days supply | Qty: 60 | Fill #2

## 2017-08-13 MED FILL — BUPROPION HCL XL 150 MG TAB: 150 | 30 days supply | Qty: 30 | Fill #11

## 2017-09-04 MED FILL — DESVENLAFAXINE SUC ER 50 MG: 50 | 90 days supply | Qty: 90 | Fill #0

## 2017-09-04 MED FILL — SUMATRIPTAN SUCC 100 MG TAB: 100 | 30 days supply | Qty: 8 | Fill #1

## 2017-09-08 MED FILL — ESTRADIOL 0.1 MG PATCH: 0.1 | 28 days supply | Qty: 8 | Fill #6

## 2017-09-14 MED FILL — clonazePAM 0.5 MG TABS: 0.5 | 30 days supply | Qty: 60 | Fill #3

## 2017-09-30 DIAGNOSIS — Z6826 Body mass index (BMI) 26.0-26.9, adult: Secondary | ICD-10-CM | POA: Diagnosis not present

## 2017-09-30 DIAGNOSIS — Z01419 Encounter for gynecological examination (general) (routine) without abnormal findings: Secondary | ICD-10-CM | POA: Diagnosis not present

## 2017-09-30 DIAGNOSIS — Z1231 Encounter for screening mammogram for malignant neoplasm of breast: Secondary | ICD-10-CM | POA: Diagnosis not present

## 2017-09-30 MED FILL — ESTRADIOL 0.1 MG/24HR PTTW: 0.1 | 84 days supply | Qty: 24 | Fill #0

## 2017-09-30 MED FILL — FLUoxetine HCL 20 MG CAPS: 20 | 30 days supply | Qty: 30 | Fill #0

## 2017-10-14 MED FILL — clonazePAM 0.5 MG TABS: 0.5 | 30 days supply | Qty: 60 | Fill #4

## 2017-10-30 MED FILL — SUMATRIPTAN SUCC 100 MG TAB: 100 | 30 days supply | Qty: 8 | Fill #2

## 2017-11-06 MED FILL — FLUoxetine HCL 20 MG CAPS: 20 | 30 days supply | Qty: 30 | Fill #1

## 2017-11-13 MED FILL — clonazePAM 0.5 MG TABS: 0.5 | 30 days supply | Qty: 60 | Fill #5

## 2017-12-14 MED FILL — SUMATRIPTAN SUCC 100 MG TAB: 100 | 30 days supply | Qty: 8 | Fill #3

## 2017-12-14 MED FILL — clonazePAM 0.5 MG TABS: 0.5 | 30 days supply | Qty: 60 | Fill #0

## 2018-01-11 MED FILL — SUMATRIPTAN SUCC 100 MG TAB: 100 | 30 days supply | Qty: 8 | Fill #4

## 2018-01-11 MED FILL — clonazePAM 0.5 MG TABS: 0.5 | 30 days supply | Qty: 60 | Fill #1

## 2018-01-11 MED FILL — ESTRADIOL 0.1 MG/24HR PTTW: 0.1 | 84 days supply | Qty: 24 | Fill #1

## 2018-02-10 MED FILL — clonazePAM 0.5 MG TABS: 0.5 | 30 days supply | Qty: 60 | Fill #2

## 2018-02-10 MED FILL — SUMATRIPTAN SUCC 100 MG TAB: 100 | 30 days supply | Qty: 8 | Fill #5

## 2018-02-10 MED FILL — DESVENLAFAXINE SUC ER 50 MG: 50 | 90 days supply | Qty: 90 | Fill #0

## 2018-03-10 MED FILL — clonazePAM 0.5 MG TABS: 0.5 | 30 days supply | Qty: 60 | Fill #3

## 2018-04-09 MED FILL — clonazePAM 0.5 MG TABS: 0.5 | 30 days supply | Qty: 60 | Fill #4

## 2018-04-09 MED FILL — SUMATRIPTAN SUCC 100 MG TAB: 100 | 30 days supply | Qty: 8 | Fill #6

## 2018-05-10 MED FILL — ESTRADIOL 0.1 MG/24HR PTTW: 0.1 | 84 days supply | Qty: 24 | Fill #2

## 2018-05-10 MED FILL — SUMATRIPTAN SUCC 100 MG TAB: 100 | 30 days supply | Qty: 8 | Fill #7

## 2018-05-11 MED FILL — DESVENLAFAXINE SUC ER 50 MG: 50 | 90 days supply | Qty: 90 | Fill #1

## 2018-05-11 MED FILL — clonazePAM 0.5 MG TABS: 0.5 | 30 days supply | Qty: 60 | Fill #5

## 2018-06-14 MED FILL — SUMATRIPTAN SUCC 100 MG TAB: 100 | 30 days supply | Qty: 8 | Fill #8

## 2018-06-16 MED FILL — clonazePAM 0.5 MG TABS: 0.5 | 30 days supply | Qty: 60 | Fill #0

## 2018-07-16 MED FILL — clonazePAM 0.5 MG TABS: 0.5 | 30 days supply | Qty: 60 | Fill #1

## 2018-07-16 MED FILL — SUMAtriptan SUCCINATE 100 M: 100 | 30 days supply | Qty: 8 | Fill #9

## 2018-07-28 DIAGNOSIS — M859 Disorder of bone density and structure, unspecified: Secondary | ICD-10-CM | POA: Diagnosis not present

## 2018-07-28 DIAGNOSIS — Z0000 Encounter for general adult medical examination without abnormal findings: Secondary | ICD-10-CM | POA: Diagnosis not present

## 2018-07-28 DIAGNOSIS — R82998 Other abnormal findings in urine: Secondary | ICD-10-CM | POA: Diagnosis not present

## 2018-08-04 DIAGNOSIS — G2581 Restless legs syndrome: Secondary | ICD-10-CM | POA: Diagnosis not present

## 2018-08-04 DIAGNOSIS — M859 Disorder of bone density and structure, unspecified: Secondary | ICD-10-CM | POA: Diagnosis not present

## 2018-08-04 DIAGNOSIS — G43909 Migraine, unspecified, not intractable, without status migrainosus: Secondary | ICD-10-CM | POA: Diagnosis not present

## 2018-08-04 DIAGNOSIS — Z86010 Personal history of colonic polyps: Secondary | ICD-10-CM | POA: Diagnosis not present

## 2018-08-04 DIAGNOSIS — Z1389 Encounter for screening for other disorder: Secondary | ICD-10-CM | POA: Diagnosis not present

## 2018-08-04 DIAGNOSIS — Z8249 Family history of ischemic heart disease and other diseases of the circulatory system: Secondary | ICD-10-CM | POA: Diagnosis not present

## 2018-08-04 DIAGNOSIS — Z0000 Encounter for general adult medical examination without abnormal findings: Secondary | ICD-10-CM | POA: Diagnosis not present

## 2018-08-04 DIAGNOSIS — E7849 Other hyperlipidemia: Secondary | ICD-10-CM | POA: Diagnosis not present

## 2018-08-04 DIAGNOSIS — Z6829 Body mass index (BMI) 29.0-29.9, adult: Secondary | ICD-10-CM | POA: Diagnosis not present

## 2018-08-04 MED FILL — rOPINIRole HCL 0.5 MG TABS: 0.5 | 30 days supply | Qty: 30 | Fill #0

## 2018-08-06 ENCOUNTER — Other Ambulatory Visit: Payer: Self-pay | Admitting: Internal Medicine

## 2018-08-06 DIAGNOSIS — Z8249 Family history of ischemic heart disease and other diseases of the circulatory system: Secondary | ICD-10-CM

## 2018-08-06 DIAGNOSIS — E785 Hyperlipidemia, unspecified: Secondary | ICD-10-CM

## 2018-08-11 MED FILL — ESTRADIOL 0.1 MG/24HR PTTW: 0.1 | 84 days supply | Qty: 24 | Fill #3

## 2018-08-11 MED FILL — SUMAtriptan SUCCINATE 100 M: 100 | 30 days supply | Qty: 9 | Fill #0

## 2018-08-11 MED FILL — DESVENLAFAXINE SUC ER 50 MG: 50 | 30 days supply | Qty: 30 | Fill #0

## 2018-08-13 MED FILL — clonazePAM 0.5 MG TABS: 0.5 | 30 days supply | Qty: 60 | Fill #2

## 2018-08-19 ENCOUNTER — Ambulatory Visit
Admission: RE | Admit: 2018-08-19 | Discharge: 2018-08-19 | Disposition: A | Discharge status: Home / Self Care | Payer: 59 | Source: Ambulatory Visit | Attending: Internal Medicine | Admitting: Internal Medicine

## 2018-08-19 DIAGNOSIS — Z8249 Family history of ischemic heart disease and other diseases of the circulatory system: Secondary | ICD-10-CM

## 2018-08-19 DIAGNOSIS — E785 Hyperlipidemia, unspecified: Secondary | ICD-10-CM

## 2018-08-27 ENCOUNTER — Other Ambulatory Visit: Payer: Self-pay | Admitting: Internal Medicine

## 2018-08-27 DIAGNOSIS — K7689 Other specified diseases of liver: Secondary | ICD-10-CM

## 2018-09-03 ENCOUNTER — Ambulatory Visit
Admission: RE | Admit: 2018-09-03 | Discharge: 2018-09-03 | Disposition: A | Discharge status: Home / Self Care | Payer: 59 | Source: Ambulatory Visit | Attending: Internal Medicine | Admitting: Internal Medicine

## 2018-09-03 DIAGNOSIS — K7689 Other specified diseases of liver: Secondary | ICD-10-CM

## 2018-09-03 DIAGNOSIS — K8020 Calculus of gallbladder without cholecystitis without obstruction: Secondary | ICD-10-CM | POA: Diagnosis not present

## 2018-09-08 MED FILL — rOPINIRole HCL 0.5 MG TABS: 0.5 | 30 days supply | Qty: 30 | Fill #1

## 2018-09-15 MED FILL — clonazePAM 0.5 MG TABS: 0.5 | 30 days supply | Qty: 60 | Fill #3

## 2018-09-15 MED FILL — DESVENLAFAXINE SUC ER 50 MG: 50 | 90 days supply | Qty: 90 | Fill #0

## 2018-09-22 MED FILL — SUMAtriptan SUCCINATE 100 M: 100 | 30 days supply | Qty: 9 | Fill #1

## 2018-09-27 MED FILL — DESVENLAFAXINE SUC ER 50 MG: 50 | 90 days supply | Qty: 90 | Fill #0

## 2018-09-28 MED FILL — clonazePAM 0.5 MG TABS: 0.5 | 30 days supply | Qty: 60 | Fill #3

## 2018-10-07 MED FILL — rOPINIRole HCL 0.5 MG TABS: 0.5 | 30 days supply | Qty: 30 | Fill #2

## 2018-10-27 MED FILL — SUMAtriptan SUCCINATE 100 M: 100 | 30 days supply | Qty: 9 | Fill #2

## 2018-10-27 MED FILL — clonazePAM 0.5 MG TABS: 0.5 | 30 days supply | Qty: 60 | Fill #4

## 2018-10-29 DIAGNOSIS — Z808 Family history of malignant neoplasm of other organs or systems: Secondary | ICD-10-CM | POA: Diagnosis not present

## 2018-10-29 DIAGNOSIS — Z803 Family history of malignant neoplasm of breast: Secondary | ICD-10-CM | POA: Diagnosis not present

## 2018-10-29 DIAGNOSIS — Z1231 Encounter for screening mammogram for malignant neoplasm of breast: Secondary | ICD-10-CM | POA: Diagnosis not present

## 2018-10-29 DIAGNOSIS — Z01419 Encounter for gynecological examination (general) (routine) without abnormal findings: Secondary | ICD-10-CM | POA: Diagnosis not present

## 2018-10-29 DIAGNOSIS — Z800 Family history of malignant neoplasm of digestive organs: Secondary | ICD-10-CM | POA: Diagnosis not present

## 2018-10-29 DIAGNOSIS — Z6830 Body mass index (BMI) 30.0-30.9, adult: Secondary | ICD-10-CM | POA: Diagnosis not present

## 2018-10-29 DIAGNOSIS — Z86010 Personal history of colonic polyps: Secondary | ICD-10-CM | POA: Diagnosis not present

## 2018-10-29 MED FILL — ESTRADIOL 0.1 MG PATCH: 0.1 | 84 days supply | Qty: 24 | Fill #0

## 2018-11-01 MED FILL — CLINDAMYCIN PHOS-BENZOYL PE: 1-5 | 30 days supply | Qty: 25 | Fill #0

## 2018-11-02 ENCOUNTER — Other Ambulatory Visit: Payer: Self-pay | Admitting: Obstetrics and Gynecology

## 2018-11-02 DIAGNOSIS — R928 Other abnormal and inconclusive findings on diagnostic imaging of breast: Secondary | ICD-10-CM

## 2018-11-04 ENCOUNTER — Other Ambulatory Visit: Payer: Self-pay | Admitting: Obstetrics and Gynecology

## 2018-11-04 ENCOUNTER — Ambulatory Visit
Admission: RE | Admit: 2018-11-04 | Discharge: 2018-11-04 | Disposition: A | Discharge status: Home / Self Care | Payer: 59 | Source: Ambulatory Visit | Attending: Obstetrics and Gynecology | Admitting: Obstetrics and Gynecology

## 2018-11-04 DIAGNOSIS — R928 Other abnormal and inconclusive findings on diagnostic imaging of breast: Secondary | ICD-10-CM | POA: Diagnosis not present

## 2018-11-04 DIAGNOSIS — N6310 Unspecified lump in the right breast, unspecified quadrant: Secondary | ICD-10-CM

## 2018-11-04 DIAGNOSIS — N6489 Other specified disorders of breast: Secondary | ICD-10-CM | POA: Diagnosis not present

## 2018-11-05 ENCOUNTER — Ambulatory Visit
Admission: RE | Admit: 2018-11-05 | Discharge: 2018-11-05 | Disposition: A | Discharge status: Home / Self Care | Payer: 59 | Source: Ambulatory Visit | Attending: Obstetrics and Gynecology | Admitting: Obstetrics and Gynecology

## 2018-11-05 DIAGNOSIS — N6311 Unspecified lump in the right breast, upper outer quadrant: Secondary | ICD-10-CM | POA: Diagnosis not present

## 2018-11-05 DIAGNOSIS — N6313 Unspecified lump in the right breast, lower outer quadrant: Secondary | ICD-10-CM | POA: Diagnosis not present

## 2018-11-05 DIAGNOSIS — N6310 Unspecified lump in the right breast, unspecified quadrant: Secondary | ICD-10-CM

## 2018-11-05 DIAGNOSIS — N6011 Diffuse cystic mastopathy of right breast: Secondary | ICD-10-CM | POA: Diagnosis not present

## 2018-11-12 MED FILL — rOPINIRole HCL 0.5 MG TABS: 0.5 | 30 days supply | Qty: 30 | Fill #3

## 2018-11-26 MED FILL — SUMAtriptan SUCCINATE 100 M: 100 | 30 days supply | Qty: 9 | Fill #3

## 2018-11-26 MED FILL — clonazePAM 0.5 MG TABS: 0.5 | 30 days supply | Qty: 60 | Fill #5

## 2018-12-17 DIAGNOSIS — F5104 Psychophysiologic insomnia: Secondary | ICD-10-CM | POA: Diagnosis not present

## 2018-12-17 DIAGNOSIS — N959 Unspecified menopausal and perimenopausal disorder: Secondary | ICD-10-CM | POA: Diagnosis not present

## 2018-12-17 DIAGNOSIS — N393 Stress incontinence (female) (male): Secondary | ICD-10-CM | POA: Diagnosis not present

## 2018-12-17 DIAGNOSIS — Z809 Family history of malignant neoplasm, unspecified: Secondary | ICD-10-CM | POA: Diagnosis not present

## 2018-12-20 MED FILL — ESTRADIOL 0.075 MG/24HR PTT: 0.075 | 28 days supply | Qty: 8 | Fill #0

## 2018-12-20 MED FILL — GABAPENTIN 100 MG CAPSULE: 100 | 30 days supply | Qty: 30 | Fill #0

## 2018-12-21 MED FILL — DESVENLAFAXINE SUC ER 50 MG: 50 | 90 days supply | Qty: 90 | Fill #1

## 2018-12-21 MED FILL — rOPINIRole HCL 0.5 MG TABS: 0.5 | 30 days supply | Qty: 30 | Fill #4 | Status: TO

## 2018-12-27 MED FILL — clonazePAM 0.5 MG TABS: 0.5 | 30 days supply | Qty: 60 | Fill #0

## 2018-12-28 MED FILL — SUMAtriptan SUCCINATE 100 M: 100 | 30 days supply | Qty: 9 | Fill #4

## 2019-01-03 ENCOUNTER — Ambulatory Visit: Payer: 59 | Attending: Obstetrics and Gynecology | Admitting: Physical Therapy

## 2019-01-03 ENCOUNTER — Encounter: Payer: Self-pay | Admitting: Physical Therapy

## 2019-01-03 ENCOUNTER — Other Ambulatory Visit: Payer: Self-pay

## 2019-01-03 DIAGNOSIS — M6281 Muscle weakness (generalized): Secondary | ICD-10-CM | POA: Insufficient documentation

## 2019-01-03 DIAGNOSIS — R278 Other lack of coordination: Secondary | ICD-10-CM | POA: Insufficient documentation

## 2019-01-03 NOTE — Patient Instructions (Addendum)
Quick Contraction: Gravity Resisted (Sitting)    Sitting, quickly squeeze then fully relax pelvic floor. Perform _1__ sets of _5__. Rest for _1__ seconds between sets. Do __3_ times a day.  Copyright  VHI. All rights reserved.  Slow Contraction: Gravity Resisted (Sitting)    Sitting, slowly squeeze pelvic floor for __5_ seconds. Rest for _5__ seconds. Repeat _10__ times. Do _4__ times a day.  Copyright  VHI. All rights reserved.  Contract your pelvic when you laugh, cough or sneeze [facility] [address], [place], [place] [address] Phone # [phone] Fax [phone]

## 2019-01-03 NOTE — Therapy (Signed)
[facility] [address] [place], [place], [address] Phone: [phone]   Fax:  [phone]  Physical Therapy Evaluation  Patient Details  Name: Nolyn Eilert MRN: [id number] Date of Birth: 04-20-1961 Referring Provider (PT): Dr. Everlene Farrier   Encounter Date: 01/03/2019  PT End of Session - 01/03/19 2232    Visit Number  1    Date for PT Re-Evaluation  03/28/19    Authorization Type  UMR    PT Start Time  1615    PT Stop Time  1700    PT Time Calculation (min)  45 min    Activity Tolerance  Patient tolerated treatment well    Behavior During Therapy  [facility] for tasks assessed/performed       Past Medical History:  Diagnosis Date  . Anemia   . Complication of anesthesia    Pt states she gets post-op N&V,dehydration,decreased B/P past sedation.She denies allergy to soy and eggs  . Depression   . Insomnia   . S/P partial hysterectomy   . UTI (lower urinary tract infection)     Past Surgical History:  Procedure Laterality Date  . ABDOMINAL HYSTERECTOMY    . BLEPHAROPLASTY     bil lower  . CESAREAN SECTION  1982, 1984  . LAPAROSCOPIC HYSTERECTOMY     still has cervix, ovaries, 1 tube  . MYOMECTOMY    . sinus irrigations     two times /1968  . TONSILLECTOMY    . WISDOM TOOTH EXTRACTION      There were no vitals filed for this visit.   Subjective Assessment - 01/03/19 1623    Subjective  Patient reports in November she was coughing, sneezong, laughing and leaking urine. Patient goes to the bathroom every 2 hours or she will leak.     Patient Stated Goals  decrease urinary leakage    Currently in Pain?  No/denies    Multiple Pain Sites  No         [facility] PT Assessment - 01/03/19 0001      Assessment   Medical Diagnosis  N39.46 Mixed incontinence    Referring Provider (PT)  Dr. Everlene Farrier    Onset Date/Surgical Date  08/20/18    Prior Therapy  none      Precautions   Precautions  None       Restrictions   Weight Bearing Restrictions  No      Balance Screen   Has the patient fallen in the past 6 months  No    Has the patient had a decrease in activity level because of a fear of falling?   No    Is the patient reluctant to leave their home because of a fear of falling?   No      Home Film/video editor residence      Prior Function   Level of Independence  Independent    Vocation  Full time employment    Vocation Requirements  walking    Leisure  picking up 29# child      Cognition   Overall Cognitive Status  Within Functional Limits for tasks assessed      Posture/Postural Control   Posture/Postural Control  No significant limitations      ROM / Strength   AROM / PROM / Strength  AROM;PROM;Strength      AROM   Overall AROM   Within functional limits for tasks performed  PROM   Overall PROM   Within functional limits for tasks performed      Strength   Overall Strength Comments  abdominal strength 3/5    Right Hip Flexion  4/5    Left Hip Flexion  4/5      Palpation   Palpation comment  tightness in lower abdomen and scars, tightness in the diaphragm; tenderness located in lumbar paraspinals      Special Tests    Special Tests  Hip Special Tests    Hip Special Tests   Trendelenberg Test      Trendelenburg Test   Findings  Positive    Side  Left    Comments  right hip drops      Thomas Test    Findings  --    Side  --    Comments  --                Objective measurements completed on examination: See above findings.    Pelvic Floor Special Questions - 01/03/19 0001    Prior Pregnancies  Yes    Number of Pregnancies  2    Number of C-Sections  2    Currently Sexually Active  Yes    Urinary Leakage  Yes    Pad use  none; change clothes    Activities that cause leaking  Coughing;Sneezing;Laughing    Urinary urgency  Yes   every 2 hours   Fecal incontinence  No    Skin Integrity  Intact    Perineal  Body/Introitus   Normal    Prolapse  None    Pelvic Floor Internal Exam  Patient confirms identification and approves PT to assess pelvic floor and treatment    Exam Type  Vaginal    Palpation  tenderness located on obturator internist, levator ani, tightness along the urethra sphincter and perineal body    Strength  good squeeze, good lift, able to hold agaisnt strong resistance    Strength # of seconds  5               PT Education - 01/03/19 2228    Education Details  pelvic floor contraction in sitting for quick flicks and 5 second hold    Person(s) Educated  Patient    Methods  Explanation;Demonstration;Verbal cues;Handout    Comprehension  Returned demonstration;Verbalized understanding       PT Short Term Goals - 01/03/19 2243      PT SHORT TERM GOAL #1   Title  independent with initial HEP    Time  4    Period  Weeks    Status  New    Target Date  01/31/19      PT SHORT TERM GOAL #2   Title  full mobilty of diaphragm and abdominal scar to perform diaphragmatic breathing    Time  4    Period  Weeks    Status  New    Target Date  01/31/19      PT SHORT TERM GOAL #3   Title  understand how to perform scar massage to improve lower abdominal tissue mobility    Time  4    Period  Weeks    Status  New    Target Date  01/31/19      PT SHORT TERM GOAL #4   Title  abilty to cough and not push therapist finger out of the vaginal canal due to improve pelvic floor coordination    Time  4    Period  Weeks    Status  New    Target Date  01/31/19        PT Long Term Goals - 01/03/19 2245      PT LONG TERM GOAL #1   Title  indpendent with HEP and understand how to progress herself    Time  12    Period  Weeks    Status  New    Target Date  03/28/19      PT LONG TERM GOAL #2   Title  urinary leakage with coughing, sneezing, and laughing is none due to pelvic floor strength increased with 15 second hold    Time  12    Period  Weeks    Status  New     Target Date  03/28/19      PT LONG TERM GOAL #3   Title  increased strength of hips and abdominal to reduce trendelenberg to negative    Time  12    Period  Weeks    Status  New    Target Date  03/28/19      PT LONG TERM GOAL #4   Title  ability to fully empty her bladder due to ability to fully relax the pelvic floor     Time  12    Period  Weeks    Status  New    Target Date  03/28/19             Plan - 01/03/19 2233    Clinical Impression Statement  Patient is a 58 year old female with urinary leakage when she sneezes, coughs, and laughs. Urinary leakage started 08/2018 when she cold several colds and was coughing frequently. Patient will have to urinate every 2 hours. Sometimes patient is not able to fully empty her bladder. Patient has tenderness located around the bladder, sides of the urethra, bilateral obturator internist and levator ani. Patient has tightness along the perineal body, lower abdominal, diaphragm, and abdominal scars. Pelvic floor strength is 4/5 holding for 5 seconds. When patient would cough she will push out the therapist finger. Abdominal strength is 3/5 and hip flexion strength is 4/5.  Patient will benefit from skilled therapy to improve pelvic floor coordinaiton with activity to reduce urinary leakage.     Personal Factors and Comorbidities  Comorbidity 3+    Comorbidities  Myomectomy, c-section 2 times; Laparscopic partial Hysterectomy; Menopaussal    Examination-Activity Limitations  Continence;Toileting    Stability/Clinical Decision Making  Stable/Uncomplicated    Clinical Decision Making  Low    Rehab Potential  Excellent    PT Frequency  2x / week   for 4 weeks then 1 time per week for 8 weeks   PT Duration  12 weeks    PT Treatment/Interventions  Biofeedback;Therapeutic activities;Therapeutic exercise;Neuromuscular re-education;Manual techniques;Patient/family education;Passive range of motion;Dry needling    PT Next Visit Plan  abdominal  massage to scar and diaphgram, bulging of the perineum; soft tissue work to the pelvic floor; diaphgramtic breathing; bladder irritants; core strength    Consulted and Agree with Plan of Care  Patient       Patient will benefit from skilled therapeutic intervention in order to improve the following deficits and impairments:  Increased fascial restricitons, Decreased coordination, Decreased scar mobility, Increased muscle spasms, Decreased activity tolerance, Decreased endurance, Decreased strength  Visit Diagnosis: Muscle weakness (generalized) - Plan: PT plan of care cert/re-cert  Other lack of coordination - Plan: PT plan  of care cert/re-cert     Problem List Patient Active Problem List   Diagnosis Date Noted  . Hypotension 01/21/2012    Class: Acute  . Sepsis(995.91) 01/18/2012  . Nausea and vomiting 01/18/2012  . Leukocytosis 01/18/2012  . Headache(784.0) 01/18/2012  . Iron deficiency anemia 01/12/2011  . Reactive depression (situational) 01/12/2011  . History of UTI 01/12/2011  . Insomnia 01/12/2011  . Restless leg syndrome 01/12/2011  . Hyperlipemia 01/12/2011    Earlie Counts, PT 01/03/19 10:50 PM   [facility] [address], [place], [place], [address] Phone: [phone]   Fax:  [phone]  Name: Marquelle Balow MRN: [id number] Date of Birth: 03/08/1961

## 2019-01-06 ENCOUNTER — Encounter: Payer: Self-pay | Admitting: Physical Therapy

## 2019-01-06 ENCOUNTER — Other Ambulatory Visit: Payer: Self-pay

## 2019-01-06 ENCOUNTER — Ambulatory Visit: Payer: 59 | Admitting: Physical Therapy

## 2019-01-06 DIAGNOSIS — M6281 Muscle weakness (generalized): Secondary | ICD-10-CM | POA: Diagnosis not present

## 2019-01-06 DIAGNOSIS — R278 Other lack of coordination: Secondary | ICD-10-CM | POA: Diagnosis not present

## 2019-01-06 NOTE — Therapy (Signed)
[facility] [address] [place], [place], [address] Phone: [phone]   Fax:  [phone]  Physical Therapy Treatment  Patient Details  Name: Megan Martin MRN: [id number] Date of Birth: [birth year] Referring Provider (PT): Dr. Everlene Farrier   Encounter Date: 01/06/2019  PT End of Session - 01/06/19 1547    Visit Number  2    Date for PT Re-Evaluation  03/28/19    Authorization Type  UMR    PT Start Time  6644    PT Stop Time  1530    PT Time Calculation (min)  45 min    Activity Tolerance  Patient tolerated treatment well    Behavior During Therapy  [facility] for tasks assessed/performed       Past Medical History:  Diagnosis Date  . Anemia   . Complication of anesthesia    Pt states she gets post-op N&V,dehydration,decreased B/P past sedation.She denies allergy to soy and eggs  . Depression   . Insomnia   . S/P partial hysterectomy   . UTI (lower urinary tract infection)     Past Surgical History:  Procedure Laterality Date  . ABDOMINAL HYSTERECTOMY    . BLEPHAROPLASTY     bil lower  . CESAREAN SECTION  1982, 1984  . LAPAROSCOPIC HYSTERECTOMY     still has cervix, ovaries, 1 tube  . MYOMECTOMY    . sinus irrigations     two times /1968  . TONSILLECTOMY    . WISDOM TOOTH EXTRACTION      There were no vitals filed for this visit.  Subjective Assessment - 01/06/19 1457    Subjective  Patient is more ware of her coordination of the pelvic floor. No urinary leakage since last visit.     Patient Stated Goals  decrease urinary leakage    Currently in Pain?  No/denies    Multiple Pain Sites  No                       [facility] Adult PT Treatment/Exercise - 01/06/19 0001      Therapeutic Activites    Therapeutic Activities  Other Therapeutic Activities    Other Therapeutic Activities  correct toileting technique to expand the abdomen, relax the pelvic floor and breath out to increase  pressure to push the stool out      Neuro Re-ed    Neuro Re-ed Details   diaphgramatic breathing to move the diaphgram and pelvic floor up and down. pelvic floor contraction with breath and leaning forward, then lean forward to cough      Manual Therapy   Manual Therapy  Soft tissue mobilization;Myofascial release    Soft tissue mobilization  abdominal soft tissue work to lower abdomen, diaphgram, healed scar, along the obiques,     Myofascial Release  just above the pubic bone to release tissue             PT Education - 01/06/19 1546    Education Details  toileting technique, diaphgramatic breathing, pelvic floor contraction with leaning forward then with cough; instruction on scar massage    Person(s) Educated  Patient    Methods  Explanation;Demonstration;Verbal cues;Handout    Comprehension  Returned demonstration;Verbalized understanding       PT Short Term Goals - 01/03/19 2243      PT SHORT TERM GOAL #1   Title  independent with initial HEP    Time  4    Period  Weeks    Status  New    Target Date  01/31/19      PT SHORT TERM GOAL #2   Title  full mobilty of diaphragm and abdominal scar to perform diaphragmatic breathing    Time  4    Period  Weeks    Status  New    Target Date  01/31/19      PT SHORT TERM GOAL #3   Title  understand how to perform scar massage to improve lower abdominal tissue mobility    Time  4    Period  Weeks    Status  New    Target Date  01/31/19      PT SHORT TERM GOAL #4   Title  abilty to cough and not push therapist finger out of the vaginal canal due to improve pelvic floor coordination    Time  4    Period  Weeks    Status  New    Target Date  01/31/19        PT Long Term Goals - 01/03/19 2245      PT LONG TERM GOAL #1   Title  indpendent with HEP and understand how to progress herself    Time  12    Period  Weeks    Status  New    Target Date  03/28/19      PT LONG TERM GOAL #2   Title  urinary leakage with  coughing, sneezing, and laughing is none due to pelvic floor strength increased with 15 second hold    Time  12    Period  Weeks    Status  New    Target Date  03/28/19      PT LONG TERM GOAL #3   Title  increased strength of hips and abdominal to reduce trendelenberg to negative    Time  12    Period  Weeks    Status  New    Target Date  03/28/19      PT LONG TERM GOAL #4   Title  ability to fully empty her bladder due to ability to fully relax the pelvic floor     Time  12    Period  Weeks    Status  New    Target Date  03/28/19            Plan - 01/06/19 1458    Clinical Impression Statement  Patient has not leaked urine since last visit. Patient is able to feel her pelvic floor contraction better. Patient had increased mobility of the lower abdomen and scar after soft tissue work. Patient was able to contract the lower abdomen with greater ease. Patient able to expand the abdomen since soft tissue work to the diaphragm. Patient will benefit from skilled therapy to improve pelvic floor coordination with activity to reduce urinary leakage.     Personal Factors and Comorbidities  Comorbidity 3+    Comorbidities  Myomectomy, c-section 2 times; Laparscopic partial Hysterectomy; Menopaussal    Examination-Activity Limitations  Continence;Toileting    Stability/Clinical Decision Making  Stable/Uncomplicated    Rehab Potential  Excellent    PT Frequency  2x / week   weeks then 1 time per week for 8 weeks   PT Duration  12 weeks    PT Treatment/Interventions  Biofeedback;Therapeutic activities;Therapeutic exercise;Neuromuscular re-education;Manual techniques;Patient/family education;Passive range of motion;Dry needling    PT Next Visit Plan   bulging of the perineum; soft tissue work to  the pelvic floor; bladder irritants; core strength    Consulted and Agree with Plan of Care  Patient       Patient will benefit from skilled therapeutic intervention in order to improve the  following deficits and impairments:  Increased fascial restricitons, Decreased coordination, Decreased scar mobility, Increased muscle spasms, Decreased activity tolerance, Decreased endurance, Decreased strength  Visit Diagnosis: Muscle weakness (generalized)  Other lack of coordination     Problem List Patient Active Problem List   Diagnosis Date Noted  . Hypotension 01/21/2012    Class: Acute  . Sepsis(995.91) 01/18/2012  . Nausea and vomiting 01/18/2012  . Leukocytosis 01/18/2012  . Headache(784.0) 01/18/2012  . Iron deficiency anemia 01/12/2011  . Reactive depression (situational) 01/12/2011  . History of UTI 01/12/2011  . Insomnia 01/12/2011  . Restless leg syndrome 01/12/2011  . Hyperlipemia 01/12/2011    Earlie Counts, PT 01/06/19 3:52 PM   [facility] [address], [place], [place], [address] Phone: [phone]   Fax:  [phone]  Name: Celie Desrochers MRN: [id number] Date of Birth: October 19, 1961

## 2019-01-06 NOTE — Patient Instructions (Addendum)
Cough: Phase 2 (Sitting)    Sit. Squeeze pelvic floor and hold. Inhale. Lean shoulders forward. Exhale. Relax. Repeat ___ times. Do ___ times a day.  Copyright  VHI. All rights reserved.  Cough: Phase 2 (Sitting)    Sit. Squeeze pelvic floor and hold. Inhale. Lean shoulders forward. Exhale. Relax. Repeat _10__ times. Do _1__ times a day.  Copyright  VHI. All rights reserved.  Cough: Phase 3 (Sitting)    Squeeze pelvic floor and hold. Inhale. Lean shoulders forward. Cough. Relax. Repeat _10__ times. Do _1__ times a day.  Copyright  VHI. All rights reserved.  Sitting    Sit comfortably. Allow body's muscles to relax. Place hands on belly. Inhale slowly and deeply for _3_ seconds, so hands move out. Then take _3__ seconds to exhale. Repeat _5__ times. Do _2__ times a day. Do prior to bowel movement Copyright  VHI. All rights reserved.  Toileting Techniques for Bowel Movements (Defecation) Using your belly (abdomen) and pelvic floor muscles to have a bowel movement is usually instinctive.  Sometimes people can have problems with these muscles and have to relearn proper defecation (emptying) techniques.  If you have weakness in your muscles, organs that are falling out, decreased sensation in your pelvis, or ignore your urge to go, you may find yourself straining to have a bowel movement.  You are straining if you are: . holding your breath or taking in a huge gulp of air and holding it  . keeping your lips and jaw tensed and closed tightly . turning red in the face because of excessive pushing or forcing . developing or worsening your  hemorrhoids . getting faint while pushing . not emptying completely and have to defecate many times a day  If you are straining, you are actually making it harder for yourself to have a bowel movement.  Many people find they are pulling up with the pelvic floor muscles and closing off instead of opening the anus. Due to lack pelvic floor  relaxation and coordination the abdominal muscles, one has to work harder to push the feces out.  Many people have never been taught how to defecate efficiently and effectively.  Notice what happens to your body when you are having a bowel movement.  While you are sitting on the toilet pay attention to the following areas: . Jaw and mouth position . Angle of your hips   . Whether your feet touch the ground or not . Arm placement  . Spine position . Waist . Belly tension . Anus (opening of the anal canal)  An Evacuation/Defecation Plan   Here are the 4 basic points:  1. Lean forward enough for your elbows to rest on your knees 2. Support your feet on the floor or use a low stool if your feet don't touch the floor  3. Push out your belly as if you have swallowed a beach ball-you should feel a widening of your waist 4. Open and relax your pelvic floor muscles, rather than tightening around the anus       The following conditions my require modifications to your toileting posture:  . If you have had surgery in the past that limits your back, hip, pelvic, knee or ankle flexibility . Constipation   Your healthcare practitioner may make the following additional suggestions and adjustments:  1) Sit on the toilet  a) Make sure your feet are supported. b) Notice your hip angle and spine position-most people find it effective to lean forward or  raise their knees, which can help the muscles around the anus to relax  c) When you lean forward, place your forearms on your thighs for support  2) Relax suggestions a) Breath deeply in through your nose and out slowly through your mouth as if you are smelling the flowers and blowing out the candles. b) To become aware of how to relax your muscles, contracting and releasing muscles can be helpful.  Pull your pelvic floor muscles in tightly by using the image of holding back gas, or closing around the anus (visualize making a circle smaller) and  lifting the anus up and in.  Then release the muscles and your anus should drop down and feel open. Repeat 5 times ending with the feeling of relaxation. c) Keep your pelvic floor muscles relaxed; let your belly bulge out. d) The digestive tract starts at the mouth and ends at the anal opening, so be sure to relax both ends of the tube.  Place your tongue on the roof of your mouth with your teeth separated.  This helps relax your mouth and will help to relax the anus at the same time.  3) Empty (defecation) a) Keep your pelvic floor and sphincter relaxed, then bulge your anal muscles.  Make the anal opening wide.  b) Stick your belly out as if you have swallowed a beach ball. c) Make your belly wall hard using your belly muscles while continuing to breathe. Doing this makes it easier to open your anus. d) Breath out and give a grunt (or try using other sounds such as ahhhh, shhhhh, ohhhh or grrrrrrr).  4) Finish a) As you finish your bowel movement, pull the pelvic floor muscles up and in.  This will leave your anus in the proper place rather than remaining pushed out and down. If you leave your anus pushed out and down, it will start to feel as though that is normal and give you incorrect signals about needing to have a bowel movement. [facility] [address], [place], [place] [address] Phone # [phone] Fax [phone]

## 2019-01-12 ENCOUNTER — Ambulatory Visit: Payer: 59 | Admitting: Physical Therapy

## 2019-01-13 ENCOUNTER — Ambulatory Visit: Payer: 59 | Admitting: Physical Therapy

## 2019-01-22 MED FILL — rOPINIRole HCL 0.5 MG TABS: 0.5 | 30 days supply | Qty: 30 | Fill #0

## 2019-01-24 ENCOUNTER — Encounter: Payer: 59 | Admitting: Physical Therapy

## 2019-01-24 MED FILL — clonazePAM 0.5 MG TABS: 0.5 | 30 days supply | Qty: 60 | Fill #0

## 2019-01-27 ENCOUNTER — Encounter: Payer: 59 | Admitting: Physical Therapy

## 2019-02-02 ENCOUNTER — Encounter: Payer: 59 | Admitting: Physical Therapy

## 2019-02-03 ENCOUNTER — Encounter: Payer: 59 | Admitting: Physical Therapy

## 2019-02-07 ENCOUNTER — Encounter: Payer: 59 | Admitting: Physical Therapy

## 2019-02-08 ENCOUNTER — Encounter: Payer: 59 | Admitting: Physical Therapy

## 2019-02-15 ENCOUNTER — Encounter: Payer: 59 | Admitting: Physical Therapy

## 2019-02-16 ENCOUNTER — Encounter: Payer: 59 | Admitting: Physical Therapy

## 2019-02-21 DIAGNOSIS — L237 Allergic contact dermatitis due to plants, except food: Secondary | ICD-10-CM | POA: Diagnosis not present

## 2019-02-22 ENCOUNTER — Encounter: Payer: 59 | Admitting: Physical Therapy

## 2019-02-24 ENCOUNTER — Encounter: Payer: 59 | Admitting: Physical Therapy

## 2019-02-24 MED FILL — clonazePAM 0.5 MG TABS: 0.5 | 30 days supply | Qty: 60 | Fill #0

## 2019-02-24 MED FILL — rOPINIRole HCL 0.5 MG TABS: 0.5 | 30 days supply | Qty: 30 | Fill #1 | Status: TO

## 2019-02-28 ENCOUNTER — Encounter: Payer: 59 | Admitting: Physical Therapy

## 2019-03-03 ENCOUNTER — Encounter: Payer: 59 | Admitting: Physical Therapy

## 2019-03-08 ENCOUNTER — Encounter: Payer: 59 | Admitting: Physical Therapy

## 2019-03-10 ENCOUNTER — Encounter: Payer: 59 | Admitting: Physical Therapy

## 2019-03-12 ENCOUNTER — Encounter (HOSPITAL_BASED_OUTPATIENT_CLINIC_OR_DEPARTMENT_OTHER): Payer: Self-pay | Admitting: *Deleted

## 2019-03-12 ENCOUNTER — Emergency Department (HOSPITAL_BASED_OUTPATIENT_CLINIC_OR_DEPARTMENT_OTHER)
Admission: EM | Admit: 2019-03-12 | Discharge: 2019-03-12 | Disposition: A | Discharge status: Home / Self Care | Payer: 59 | Attending: Emergency Medicine | Admitting: Emergency Medicine

## 2019-03-12 ENCOUNTER — Other Ambulatory Visit: Payer: Self-pay

## 2019-03-12 DIAGNOSIS — R51 Headache: Secondary | ICD-10-CM | POA: Diagnosis present

## 2019-03-12 DIAGNOSIS — G43101 Migraine with aura, not intractable, with status migrainosus: Secondary | ICD-10-CM | POA: Diagnosis not present

## 2019-03-12 DIAGNOSIS — Z79899 Other long term (current) drug therapy: Secondary | ICD-10-CM | POA: Insufficient documentation

## 2019-03-12 HISTORY — DX: Migraine, unspecified, not intractable, without status migrainosus: G43.909

## 2019-03-12 MED ORDER — SODIUM CHLORIDE 0.9 % IV BOLUS
1000.0000 mL | Freq: Once | INTRAVENOUS | Status: AC
  Administered 2019-03-12: 1000 mL via INTRAVENOUS

## 2019-03-12 MED ORDER — PROCHLORPERAZINE EDISYLATE 10 MG/2ML IJ SOLN
10.0000 mg | Freq: Once | INTRAMUSCULAR | Status: AC
  Administered 2019-03-12: 10 mg via INTRAVENOUS
  Filled 2019-03-12: qty 2

## 2019-03-12 MED ORDER — DIPHENHYDRAMINE HCL 50 MG/ML IJ SOLN
25.0000 mg | Freq: Once | INTRAMUSCULAR | Status: AC
  Administered 2019-03-12: 25 mg via INTRAVENOUS
  Filled 2019-03-12: qty 1

## 2019-03-12 MED ORDER — ONDANSETRON 4 MG PO TBDP: ORAL_TABLET | ORAL | 0 refills | Status: DC

## 2019-03-12 NOTE — ED Provider Notes (Signed)
[facility] EMERGENCY DEPARTMENT Provider Note   CSN: [id number] Arrival date & time: 03/12/19  2100    History   Chief Complaint Chief Complaint  Patient presents with  . Migraine    HPI Megan Martin is a 58 y.o. female.     58 yo F with a chief complaints of headache.  This feels like her typical headaches that she is in the past.  She has a prescription for sumatriptan but unfortunately has been unable to keep it down.  She thinks likely the symptoms are due to the insomnia which is secondary to steroid use for diffuse poison ivy.  The patient states she had recently been treated for this with a steroid taper following by intramuscular steroids.  Not a started to clear but she has had persistent headaches for about 4 days.  Denies head injury denies fevers denies neck pain.  Denies unilateral numbness or tingling denies difficulty with speech or swallowing.  Denies difficulty with ambulation.  Worse with bright lights.  Having some nausea and vomiting.  The history is provided by the patient.  Migraine  This is a recurrent problem. The current episode started more than 2 days ago. The problem occurs constantly. The problem has been gradually worsening. Associated symptoms include headaches. Pertinent negatives include no chest pain, no abdominal pain and no shortness of breath. Nothing aggravates the symptoms. Nothing relieves the symptoms. She has tried nothing for the symptoms. The treatment provided no relief.    Past Medical History:  Diagnosis Date  . Anemia   . Complication of anesthesia    Pt states she gets post-op N&V,dehydration,decreased B/P past sedation.She denies allergy to soy and eggs  . Depression   . Insomnia   . Migraine   . S/P partial hysterectomy   . UTI (lower urinary tract infection)     Patient Active Problem List   Diagnosis Date Noted  . Hypotension 01/21/2012    Class: Acute  . Sepsis(995.91) 01/18/2012  . Nausea and vomiting  01/18/2012  . Leukocytosis 01/18/2012  . Headache(784.0) 01/18/2012  . Iron deficiency anemia 01/12/2011  . Reactive depression (situational) 01/12/2011  . History of UTI 01/12/2011  . Insomnia 01/12/2011  . Restless leg syndrome 01/12/2011  . Hyperlipemia 01/12/2011    Past Surgical History:  Procedure Laterality Date  . ABDOMINAL HYSTERECTOMY    . BLEPHAROPLASTY     bil lower  . CESAREAN SECTION  1982, 1984  . LAPAROSCOPIC HYSTERECTOMY     still has cervix, ovaries, 1 tube  . MYOMECTOMY    . sinus irrigations     two times /1968  . TONSILLECTOMY    . WISDOM TOOTH EXTRACTION       OB History   No obstetric history on file.      Home Medications    Prior to Admission medications   Medication Sig Start Date End Date Taking? Authorizing Provider  calcium-vitamin D (OSCAL WITH D) 500-200 MG-UNIT per tablet Take 1 tablet by mouth daily.    [provider]  cholecalciferol (VITAMIN D) 1000 UNITS tablet Take by mouth daily. Take 5000 units daily    [provider]  clonazePAM (KLONOPIN) 0.5 MG tablet Take 0.5 mg by mouth at bedtime.    [provider]  estradiol (VIVELLE-DOT) 0.1 MG/24HR Place 1 patch onto the skin 2 (two) times a week.      [provider]  naproxen (NAPROSYN) 375 MG tablet Take 1 tablet (375 mg total) by  mouth 2 (two) times daily. Patient not taking: Reported on 01/03/2019 12/31/16   Palumbo, April, MD  ondansetron [facility] ODT) 8 MG disintegrating tablet 8mg  ODT q8 hours prn nausea Patient not taking: Reported on 01/03/2019 12/31/16   Palumbo, April, MD  PRISTIQ 50 MG 24 hr tablet TAKE 1 TABLET BY MOUTH DAILY Patient taking differently: TAKE 1 TABLET BY MOUTH AT BEDTIME 08/21/11   de Ronda Fairly, [place], DO    Family History Family History  Problem Relation Age of Onset  . Heart disease Mother   . Hypertension Mother   . Heart disease Father   . Early death Father   . Hypertension Brother   . Hyperlipidemia Brother   .  Heart disease Brother   . Colon cancer Paternal Uncle     Social History Social History   Tobacco Use  . Smoking status: Never Smoker  . Smokeless tobacco: Never Used  Substance Use Topics  . Alcohol use: No  . Drug use: No     Allergies   Demerol and Reglan [metoclopramide]   Review of Systems Review of Systems  Constitutional: Negative for chills and fever.  HENT: Negative for congestion and rhinorrhea.   Eyes: Negative for redness and visual disturbance.  Respiratory: Negative for shortness of breath and wheezing.   Cardiovascular: Negative for chest pain and palpitations.  Gastrointestinal: Negative for abdominal pain, nausea and vomiting.  Genitourinary: Negative for dysuria and urgency.  Musculoskeletal: Negative for arthralgias and myalgias.  Skin: Negative for pallor and wound.  Neurological: Positive for headaches. Negative for dizziness.     Physical Exam Updated Vital Signs BP (!) 140/95   Pulse 99   Temp 97.7 F (36.5 C) (Oral)   Resp 18   Ht 5\' 2"  (1.575 m)   Wt 70.3 kg   SpO2 100%   BMI 28.35 kg/m   Physical Exam Vitals signs and nursing note reviewed.  Constitutional:      General: She is not in acute distress.    Appearance: She is well-developed. She is not diaphoretic.  HENT:     Head: Normocephalic and atraumatic.  Eyes:     Pupils: Pupils are equal, round, and reactive to light.  Neck:     Musculoskeletal: Normal range of motion and neck supple.  Cardiovascular:     Rate and Rhythm: Normal rate and regular rhythm.     Heart sounds: No murmur. No friction rub. No gallop.   Pulmonary:     Effort: Pulmonary effort is normal.     Breath sounds: No wheezing or rales.  Abdominal:     General: There is no distension.     Palpations: Abdomen is soft.     Tenderness: There is no abdominal tenderness.  Musculoskeletal:        General: No tenderness.  Skin:    General: Skin is warm and dry.  Neurological:     Mental Status: She is  alert and oriented to person, place, and time.     Cranial Nerves: Cranial nerves are intact.     Sensory: Sensation is intact.     Motor: Motor function is intact.     Coordination: Coordination is intact.     Comments: The patient has a generalized tremor.  States that she has had this off and on.  States that mom has similar tremor.  Psychiatric:        Behavior: Behavior normal.      ED Treatments / Results  Labs (all labs  ordered are listed, but only abnormal results are displayed) Labs Reviewed - No data to display  EKG None  Radiology No results found.  Procedures Procedures (including critical care time)  Medications Ordered in ED Medications  sodium chloride 0.9 % bolus 1,000 mL (0 mLs Intravenous Stopped 03/12/19 2222)  diphenhydrAMINE (BENADRYL) injection 25 mg (25 mg Intravenous Given 03/12/19 2141)  prochlorperazine (COMPAZINE) injection 10 mg (10 mg Intravenous Given 03/12/19 2219)     Initial Impression / Assessment and Plan / ED Course  I have reviewed the triage vital signs and the nursing notes.  Pertinent labs & imaging results that were available during my care of the patient were reviewed by me and considered in my medical decision making (see chart for details).        58 yo F with a chief complaint of a headache.  Has a history of migraines and this feels typical.  Will give a headache cocktail.  She has had akathisia's previously with Reglan.  Will pretreat with Benadryl prior to giving Compazine.  Fluid bolus due to persistent vomiting.  Reassess.  Patient is feeling much better on reassessment and requesting discharge home.  PCP follow-up.  10:47 PM:  I have discussed the diagnosis/risks/treatment options with the patient and believe the pt to be eligible for discharge home to follow-up with PCP, neuro. We also discussed returning to the ED immediately if new or worsening sx occur. We discussed the sx which are most concerning (e.g., sudden  worsening pain, fever, inability to tolerate by mouth) that necessitate immediate return. Medications administered to the patient during their visit and any new prescriptions provided to the patient are listed below.  Medications given during this visit Medications  sodium chloride 0.9 % bolus 1,000 mL (0 mLs Intravenous Stopped 03/12/19 2222)  diphenhydrAMINE (BENADRYL) injection 25 mg (25 mg Intravenous Given 03/12/19 2141)  prochlorperazine (COMPAZINE) injection 10 mg (10 mg Intravenous Given 03/12/19 2219)     The patient appears reasonably screen and/or stabilized for discharge and I doubt any other medical condition or other [facility] requiring further screening, evaluation, or treatment in the ED at this time prior to discharge.    Final Clinical Impressions(s) / ED Diagnoses   Final diagnoses:  Migraine with aura and with status migrainosus, not intractable    ED Discharge Orders    None       Deno Etienne, DO 03/12/19 2247

## 2019-03-12 NOTE — Discharge Instructions (Signed)
Call your neurologist and let them know you had a breakthrough migraine and see if they want to change your medications.

## 2019-03-12 NOTE — ED Triage Notes (Signed)
Pt reports mha since Thursday. Hx of same. She has vomited multiple times since Friday. She took her migraine medication but vomited it

## 2019-03-15 ENCOUNTER — Encounter: Payer: 59 | Admitting: Physical Therapy

## 2019-03-17 ENCOUNTER — Encounter: Payer: 59 | Admitting: Physical Therapy

## 2019-03-18 MED FILL — ESTRADIOL 0.075 MG/24HR PTT: 0.075 | 28 days supply | Qty: 8 | Fill #1

## 2019-03-18 MED FILL — DESVENLAFAXINE SUC ER 50 MG: 50 | 90 days supply | Qty: 90 | Fill #2

## 2019-03-18 MED FILL — SUMAtriptan SUCCINATE 100 M: 100 | 30 days supply | Qty: 9 | Fill #5

## 2019-03-21 MED FILL — rOPINIRole HCL 0.5 MG TABS: 0.5 | 30 days supply | Qty: 30 | Fill #0

## 2019-03-21 MED FILL — CLINDAMYCIN PHOS-BENZOYL PE: 1-5 | 30 days supply | Qty: 25 | Fill #1

## 2019-03-24 ENCOUNTER — Encounter: Payer: 59 | Admitting: Physical Therapy

## 2019-03-28 MED FILL — clonazePAM 0.5 MG TABS: 0.5 | 30 days supply | Qty: 60 | Fill #0

## 2019-04-19 MED FILL — SUMAtriptan SUCCINATE 100 M: 100 | 30 days supply | Qty: 9 | Fill #6

## 2019-04-19 MED FILL — rOPINIRole HCL 0.5 MG TABS: 0.5 | 30 days supply | Qty: 30 | Fill #1

## 2019-04-26 MED FILL — clonazePAM 0.5 MG TABS: 0.5 | 30 days supply | Qty: 60 | Fill #0

## 2019-05-20 MED FILL — rOPINIRole HCL 0.5 MG TABS: 0.5 | 30 days supply | Qty: 30 | Fill #2

## 2019-05-20 MED FILL — ESTRADIOL 0.075 MG/24HR PTT: 0.075 | 28 days supply | Qty: 8 | Fill #2

## 2019-05-20 MED FILL — SUMAtriptan SUCCINATE 100 M: 100 | 30 days supply | Qty: 9 | Fill #7

## 2019-05-20 MED FILL — CLINDAMYCIN PHOS-BENZOYL PE: 1-5 | 30 days supply | Qty: 25 | Fill #2

## 2019-05-27 MED FILL — clonazePAM 0.5 MG TABS: 0.5 | 30 days supply | Qty: 60 | Fill #0

## 2019-06-13 ENCOUNTER — Ambulatory Visit: Payer: 59 | Attending: Obstetrics and Gynecology | Admitting: Physical Therapy

## 2019-06-13 ENCOUNTER — Encounter: Payer: Self-pay | Admitting: Physical Therapy

## 2019-06-13 ENCOUNTER — Other Ambulatory Visit: Payer: Self-pay

## 2019-06-13 DIAGNOSIS — M6281 Muscle weakness (generalized): Secondary | ICD-10-CM | POA: Insufficient documentation

## 2019-06-13 DIAGNOSIS — R278 Other lack of coordination: Secondary | ICD-10-CM | POA: Diagnosis not present

## 2019-06-13 NOTE — Therapy (Signed)
[facility] [address] [place], [place], [address] Phone: [phone]   Fax:  [phone]  Physical Therapy Treatment  Patient Details  Name: Megan Martin MRN: [id number] Date of Birth: [birth year] Referring Provider (PT): Dr. Everlene Farrier   Encounter Date: 06/13/2019  PT End of Session - 06/13/19 1343    Visit Number  3    Date for PT Re-Evaluation  09/05/19    Authorization Type  [facility]    PT Start Time  1330    PT Stop Time  1410    PT Time Calculation (min)  40 min    Activity Tolerance  Patient tolerated treatment well    Behavior During Therapy  [facility] for tasks assessed/performed       Past Medical History:  Diagnosis Date  . Anemia   . Complication of anesthesia    Pt states she gets post-op N&V,dehydration,decreased B/P past sedation.She denies allergy to soy and eggs  . Depression   . Insomnia   . Migraine   . S/P partial hysterectomy   . UTI (lower urinary tract infection)     Past Surgical History:  Procedure Laterality Date  . ABDOMINAL HYSTERECTOMY    . BLEPHAROPLASTY     bil lower  . CESAREAN SECTION  1982, 1984  . LAPAROSCOPIC HYSTERECTOMY     still has cervix, ovaries, 1 tube  . MYOMECTOMY    . sinus irrigations     two times /1968  . TONSILLECTOMY    . WISDOM TOOTH EXTRACTION      There were no vitals filed for this visit.  Subjective Assessment - 06/13/19 1340    Subjective  I have not kept up with my exercises. Leak urine with cough or sneeze.    Patient Stated Goals  decrease urinary leakage    Currently in Pain?  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 Everlene Farrier    Onset Date/Surgical Date  08/20/18    Prior Therapy  None      Precautions   Precautions  None      Restrictions   Weight Bearing Restrictions  No      Home  Environment   Living Environment  Private residence      Prior Function   Level of Independence  Independent    Vocation  Full time employment      Cognition   Overall Cognitive Status  Within Functional Limits for tasks assessed      ROM / Strength   AROM / PROM / Strength  AROM;PROM;Strength      Strength   Overall Strength Comments  Abdominal strength 3/5    Right Hip ABduction  4/5    Right Hip ADduction  5/5    Left Hip ABduction  5/5    Left Hip ADduction  4/5                Pelvic Floor Special Questions - 06/13/19 0001    Currently Sexually Active  No    Urinary Leakage  Yes    Pad use  none compared to before was wearing 1    Activities that cause leaking  Coughing;Sneezing    Urinary urgency  Yes   able to hold her urine to  get to the bathroom   Urinary frequency  goes every 3 hour    Fecal incontinence  No    Skin Integrity  Intact    Pelvic Floor Internal Exam  Patient confirms identification and  approves PT treatment and assessment    Exam Type  Vaginal    Palpation  tenderness located on bil. levator ani; tightness on the bulbocavernosus; bear down when cough    Strength  weak squeeze, no lift   After manual increased to 3/5   Strength # of reps  3    Strength # of seconds  5        [facility] Adult PT Treatment/Exercise - 06/13/19 0001      Neuro Re-ed    Neuro Re-ed Details   tactile cues to pelvic floor to form a circular contraction holding 3-5 seconds in supine; sitting pelvic floor contraction holding for 5 sec 5 times      Manual Therapy   Manual Therapy  Internal Pelvic Floor    Internal Pelvic Floor  bil. levator ani and obturator internist, bulbocavernosus               PT Short Term Goals - 06/13/19 1352      PT SHORT TERM GOAL #1   Title  independent with initial HEP    Time  4    Period  Weeks    Status  Achieved    Target Date  01/31/19      PT SHORT TERM GOAL #2   Title  full mobilty of diaphragm and abdominal  scar to perform diaphragmatic breathing    Time  4    Period  Weeks    Status  On-going      PT SHORT TERM GOAL #3   Title  understand how to perform scar massage to improve lower abdominal tissue mobility    Time  4    Period  Weeks    Status  On-going    Target Date  01/31/19      PT SHORT TERM GOAL #4   Title  abilty to cough and not push therapist finger out of the vaginal canal due to improve pelvic floor coordination    Time  4    Period  Weeks    Status  On-going    Target Date  01/31/19        PT Long Term Goals - 06/13/19 1630      PT LONG TERM GOAL #1   Title  indpendent with HEP and understand how to progress herself    Time  12    Period  Weeks    Status  On-going      PT LONG TERM GOAL #2   Title  urinary leakage with coughing, sneezing, and laughing is none due to pelvic floor strength increased with 15 second hold    Time  12    Period  Weeks    Status  On-going      PT LONG TERM GOAL #3   Title  increased strength of hips and abdominal to reduce trendelenberg to negative    Time  12    Period  Weeks    Status  On-going      PT LONG TERM GOAL #4   Title  ability to fully empty her bladder due to ability to fully relax the pelvic floor     Time  12    Period  Weeks    Status  On-going            Plan - 06/13/19 1624    Clinical Impression Statement  Patietn has not been in therapy since 01/06/2019 due to Drain and miscommunication on when she is to start therapy. Patient reports she is only leaking urine with coughing and sneezing. Patient is not wearing pads now. Pelvic floor strength decreased to 2/5. Patient has tightness in the bulbocavernosus muscle with pelvic floor contraction more anterior and posterior. When patient coughs she will push the therapist finger out of the vaginal canal. Abdominal strength is 3/5. Right hip abductor and left hip adductor is 4/5. Patient is now able to hold her urine for 3 hours instead of 2 hours. Patient will  benefit from skilled therapy to improve pelvic floor coordination with activity to reduce urinary leakage.    Personal Factors and Comorbidities  Comorbidity 3+    Comorbidities  Myomectomy, c-section 2 times; Laparscopic partial Hysterectomy; Menopaussal    Examination-Activity Limitations  Continence;Toileting    Stability/Clinical Decision Making  Stable/Uncomplicated    Rehab Potential  Excellent    PT Frequency  1x / week    PT Duration  12 weeks    PT Treatment/Interventions  Biofeedback;Therapeutic activities;Therapeutic exercise;Neuromuscular re-education;Manual techniques;Patient/family education;Passive range of motion;Dry needling    PT Next Visit Plan   bulging of the perineum; soft tissue work to the pelvic floor; bladder irritants; core strength    Recommended Other Services  sent renewal note to MD    Consulted and Agree with Plan of Care  Patient       Patient will benefit from skilled therapeutic intervention in order to improve the following deficits and impairments:  Increased fascial restricitons, Decreased coordination, Decreased scar mobility, Increased muscle spasms, Decreased activity tolerance, Decreased endurance, Decreased strength  Visit Diagnosis: Muscle weakness (generalized) - Plan: PT plan of care cert/re-cert  Other lack of coordination - Plan: PT plan of care cert/re-cert     Problem List Patient Active Problem List   Diagnosis Date Noted  . Hypotension 01/21/2012    Class: Acute  . Sepsis(995.91) 01/18/2012  . Nausea and vomiting 01/18/2012  . Leukocytosis 01/18/2012  . Headache(784.0) 01/18/2012  . Iron deficiency anemia 01/12/2011  . Reactive depression (situational) 01/12/2011  . History of UTI 01/12/2011  . Insomnia 01/12/2011  . Restless leg syndrome 01/12/2011  . Hyperlipemia 01/12/2011    Earlie Counts, PT 06/13/19 4:34 PM   [facility] [address] [place],  [place], [address] Phone: [phone]   Fax:  [phone]  Name: Megan Martin MRN: [id number] Date of Birth: Mar 08, 1961

## 2019-06-13 NOTE — Patient Instructions (Signed)
Quick Contraction: Gravity Resisted (Sitting)    Sitting, quickly squeeze then fully relax pelvic floor. Perform _1__ sets of _5__. Rest for _1__ seconds between sets. Do __3_ times a day.  Copyright  VHI. All rights reserved.  Slow Contraction: Gravity Resisted (Sitting)    Sitting, slowly squeeze pelvic floor for __5_ seconds. Rest for _5__ seconds. Repeat _10__ times. Do _4__ times a day.  [facility] [address], [place], [place] [address] Phone # [phone] Fax [phone]

## 2019-06-23 ENCOUNTER — Encounter: Payer: Self-pay | Admitting: Physical Therapy

## 2019-06-23 ENCOUNTER — Ambulatory Visit: Payer: 59 | Attending: Obstetrics and Gynecology | Admitting: Physical Therapy

## 2019-06-23 ENCOUNTER — Other Ambulatory Visit: Payer: Self-pay

## 2019-06-23 DIAGNOSIS — R278 Other lack of coordination: Secondary | ICD-10-CM | POA: Diagnosis not present

## 2019-06-23 DIAGNOSIS — M6281 Muscle weakness (generalized): Secondary | ICD-10-CM | POA: Diagnosis not present

## 2019-06-23 MED FILL — ESTRADIOL 0.075 MG/24HR PTT: 0.075 | 28 days supply | Qty: 8 | Fill #3

## 2019-06-23 MED FILL — SUMAtriptan SUCCINATE 100 M: 100 | 30 days supply | Qty: 9 | Fill #8

## 2019-06-23 MED FILL — DESVENLAFAXINE SUC ER 50 MG: 50 | 90 days supply | Qty: 90 | Fill #0

## 2019-06-23 MED FILL — rOPINIRole HCL 0.5 MG TABS: 0.5 | 30 days supply | Qty: 30 | Fill #3

## 2019-06-23 MED FILL — CLINDAMYCIN PHOS-BENZOYL PE: 1-5 | 30 days supply | Qty: 25 | Fill #3

## 2019-06-23 NOTE — Patient Instructions (Addendum)
Quick Contraction: Gravity Resisted (Sitting)    Sitting, quickly squeeze then fully relax pelvic floor. Perform _1__ sets of _5__. Rest for _1__ seconds between sets. Do __3_ times a day.  Copyright  VHI. All rights reserved. Slow Contraction: Gravity Resisted (Sitting)    Sitting, slowly squeeze pelvic floor for __3-5_ seconds. Rest for _5__ seconds. Repeat _10__ times. Do _4__ times a day.  Slow Contraction: Gravity Eliminated (Supine)    Lie flat. Slowly squeeze pelvic floor for _5__ seconds. Rest for _5__ seconds. Repeat _10__ times. Do _2__ times a day. Can have your knees bent.   Copyright  VHI. All rights reserved.  Cough: Phase 2 (Sitting)    Sit. Squeeze pelvic floor and hold. Inhale. Lean shoulders forward. Exhale. Relax. Repeat __5_ times. Do _1__ times a day. Squeeze, bend, blow Copyright  VHI. All rights reserved.  Slow Contraction: Gravity Resisted (Standing)    Standing, slowly squeeze pelvic floor for _3__ seconds. Rest for __5_ seconds. Repeat _5__ times. Do _1-2__ times a day.  Copyright  VHI. All rights reserved.  [facility] [address], [place], [place] [address] Phone # [phone] Fax [phone] ABDUCTION: Standing (Active)    Stand, feet flat. Lift right leg out to side.  Complete _1__ sets of _15__ repetitions. Each leg Perform __1_ sessions per day.  http://gtsc.exer.us/111   Copyright  VHI. All rights reserved.

## 2019-06-23 NOTE — Therapy (Signed)
[facility] [address], [place], [place], [address] Phone: [phone]   Fax:  [phone]  Physical Therapy Treatment  Patient Details  Name: Megan Martin MRN: [id number] Date of Birth: 19-Apr-1961 Referring Provider (PT): Dr. Everlene Farrier   Encounter Date: 06/23/2019  PT End of Session - 06/23/19 1435    Visit Number  4    Date for PT Re-Evaluation  09/05/19    Authorization Type  UMR    PT Start Time  1400    PT Stop Time  1432    PT Time Calculation (min)  32 min    Activity Tolerance  Patient tolerated treatment well    Behavior During Therapy  [facility] for tasks assessed/performed       Past Medical History:  Diagnosis Date  . Anemia   . Complication of anesthesia    Pt states she gets post-op N&V,dehydration,decreased B/P past sedation.She denies allergy to soy and eggs  . Depression   . Insomnia   . Migraine   . S/P partial hysterectomy   . UTI (lower urinary tract infection)     Past Surgical History:  Procedure Laterality Date  . ABDOMINAL HYSTERECTOMY    . BLEPHAROPLASTY     bil lower  . CESAREAN SECTION  1982, 1984  . LAPAROSCOPIC HYSTERECTOMY     still has cervix, ovaries, 1 tube  . MYOMECTOMY    . sinus irrigations     two times /1968  . TONSILLECTOMY    . WISDOM TOOTH EXTRACTION      There were no vitals filed for this visit.  Subjective Assessment - 06/23/19 1406    Subjective  I was able to do my exercises. I notice a difference since I did my exercises.    Patient Stated Goals  decrease urinary leakage    Currently in Pain?  No/denies                    Pelvic Floor Special Questions - 06/23/19 0001    Pelvic Floor Internal Exam  Patient confirms identification and  approves PT treatment and assessment    Exam Type  Vaginal    Strength  fair squeeze, definite lift    Strength # of reps  10    Strength # of seconds  5    Tone  6 quick flicks before fatique         [facility] Adult PT Treatment/Exercise - 06/23/19 0001      Neuro Re-ed    Neuro Re-ed Details   tactile cues to continue pelvic floor contraction in supine for 5 seconds ; quick flicks with resting fully between, contract and slowly release the muscles; sitting contract bend forward and blow to facilitate a cough; Standing hip abduction and walking sideways to facilitate the pelvic floor muscles; standing contract 3 seconds with control.       Lumbar Exercises: Aerobic   Nustep  level 3 for 5 minutes while assessing the patient             PT Education - 06/23/19 1433    Education Details  pelvic floor exercises in sitting, supine and standing, contract with blowing out    Person(s) Educated  Patient    Methods  Explanation;Demonstration;Verbal cues;Handout    Comprehension  Verbalized understanding;Returned demonstration       PT Short Term Goals - 06/23/19 1438      PT SHORT TERM GOAL #2   Title  full mobilty of diaphragm and abdominal scar to perform diaphragmatic breathing    Time  4    Period  Weeks    Status  Achieved    Target Date  01/31/19      PT SHORT TERM GOAL #3   Title  understand how to perform scar massage to improve lower abdominal tissue mobility    Time  4    Period  Weeks    Status  Achieved      PT SHORT TERM GOAL #4   Title  abilty to cough and not push therapist finger out of the vaginal canal due to improve pelvic floor coordination    Time  4    Period  Weeks    Target Date  01/31/19        PT Long Term Goals - 06/23/19 1428      PT LONG TERM GOAL #3   Title  increased strength of hips and abdominal to reduce trendelenberg to negative    Baseline  no trendelenberg    Time  12    Period  Weeks    Status  On-going            Plan - 06/23/19 1435    Clinical Impression Statement  Patient pelvic floor strength increased to 3/5 and holding for 5 sec in supine. Patient able to perform 6 quick flicks in supine. Patient did need  tapping of the pelvic floor muscles to work for 5 seconds and afterwards she was able to contract the full 5 seconds. Patient is working the hip abductors. Patient has no Trendelenberg. Patient will benefit from skilled therapy to improve pelvic floor coordination with activity to reduce urinary leakage.    Personal Factors and Comorbidities  Comorbidity 3+    Comorbidities  Myomectomy, c-section 2 times; Laparscopic partial Hysterectomy; Menopaussal    Examination-Activity Limitations  Continence;Toileting    Stability/Clinical Decision Making  Stable/Uncomplicated    Rehab Potential  Excellent    PT Frequency  1x / week    PT Duration  12 weeks    PT Treatment/Interventions  Biofeedback;Therapeutic activities;Therapeutic exercise;Neuromuscular re-education;Manual techniques;Patient/family education;Passive range of motion;Dry needling    PT Next Visit Plan  plie, core strength, pelvic floor contraction supine 8 seconds, sitting 5 seconds and standing 5 seconds    Consulted and Agree with Plan of Care  Patient       Patient will benefit from skilled therapeutic intervention in order to improve the following deficits and impairments:  Increased fascial restricitons, Decreased coordination, Decreased scar mobility, Increased muscle spasms, Decreased activity tolerance, Decreased endurance, Decreased strength  Visit Diagnosis: Muscle weakness (generalized)  Other lack of coordination     Problem List Patient Active Problem List   Diagnosis Date Noted  . Hypotension 01/21/2012    Class: Acute  . Sepsis(995.91) 01/18/2012  . Nausea and vomiting 01/18/2012  . Leukocytosis 01/18/2012  . Headache(784.0) 01/18/2012  . Iron deficiency anemia 01/12/2011  . Reactive depression (situational) 01/12/2011  . History of UTI 01/12/2011  . Insomnia 01/12/2011  . Restless leg syndrome 01/12/2011  . Hyperlipemia 01/12/2011    Earlie Counts, PT 06/23/19 2:40 PM   [facility] [address] [place], [place], [address] Phone: [phone]   Fax:  [phone]  Name: Kewana Miars MRN: [id number] Date of Birth: [birth year]

## 2019-06-29 ENCOUNTER — Ambulatory Visit: Payer: 59 | Admitting: Physical Therapy

## 2019-06-29 MED FILL — clonazePAM 0.5 MG TABS: 0.5 | 30 days supply | Qty: 60 | Fill #0

## 2019-07-13 ENCOUNTER — Ambulatory Visit: Payer: 59 | Admitting: Physical Therapy

## 2019-07-18 ENCOUNTER — Ambulatory Visit: Payer: 59 | Admitting: Physical Therapy

## 2019-07-18 ENCOUNTER — Encounter: Payer: Self-pay | Admitting: Physical Therapy

## 2019-07-18 ENCOUNTER — Other Ambulatory Visit: Payer: Self-pay

## 2019-07-18 DIAGNOSIS — R278 Other lack of coordination: Secondary | ICD-10-CM

## 2019-07-18 DIAGNOSIS — M6281 Muscle weakness (generalized): Secondary | ICD-10-CM

## 2019-07-18 NOTE — Patient Instructions (Addendum)
Slow Contraction: Gravity Resisted (Sitting)    Sitting, slowly squeeze pelvic floor for __10_ seconds. Rest for _5__ seconds. Repeat _10__ times. Do _4__ times a day. End off with 5 quick flicks  Copyright  VHI. All rights reserved.  Cough: Phase 4 (Sitting)    Squeeze pelvic floor and hold. Inhale. Lean shoulders forward. Cough repeatedly. Relax. Repeat _5__ times. Do __1_ times a day.  Copyright  VHI. All rights reserved.  Slow Contraction: Gravity Resisted (Standing)    Standing, slowly squeeze pelvic floor for _5__ seconds. Rest for __5_ seconds. Repeat _5__ times. Do _2__ times a day. Quickly tighten the pelvic floor and relax.  Copyright  VHI. All rights reserved.   5 jumps with contract pelvic floor jump as you breath out.   [facility] [address], [place], [place] [address] Phone # [phone] Fax [phone]    Dr. Vickey Sages [employer] - Dilator Set Purple Dilator with [facility] [address] [place], [place] [address] Phone # [phone] Fax [phone]

## 2019-07-18 NOTE — Therapy (Addendum)
[facility] [address] [place], [place], [address] Phone: [phone]   Fax:  [phone]  Physical Therapy Treatment  Patient Details  Name: Megan Martin MRN: [id number] Date of Birth: [birth year] Referring Provider (PT): Dr. Everlene Farrier   Encounter Date: 07/18/2019  PT End of Session - 07/18/19 1623    Visit Number  5    Date for PT Re-Evaluation  09/05/19    Authorization Type  UMR    PT Start Time  1545    PT Stop Time  1615    PT Time Calculation (min)  30 min    Activity Tolerance  Patient tolerated treatment well    Behavior During Therapy  [facility] for tasks assessed/performed       Past Medical History:  Diagnosis Date  . Anemia   . Complication of anesthesia    Pt states she gets post-op N&V,dehydration,decreased B/P past sedation.She denies allergy to soy and eggs  . Depression   . Insomnia   . Migraine   . S/P partial hysterectomy   . UTI (lower urinary tract infection)     Past Surgical History:  Procedure Laterality Date  . ABDOMINAL HYSTERECTOMY    . BLEPHAROPLASTY     bil lower  . CESAREAN SECTION  1982, 1984  . LAPAROSCOPIC HYSTERECTOMY     still has cervix, ovaries, 1 tube  . MYOMECTOMY    . sinus irrigations     two times /1968  . TONSILLECTOMY    . WISDOM TOOTH EXTRACTION      There were no vitals filed for this visit.  Subjective Assessment - 07/18/19 1547    Subjective  I have done my exercises.    Patient Stated Goals  decrease urinary leakage    Currently in Pain?  No/denies         [facility] PT Assessment - 07/18/19 0001      Special Tests    Special Tests  Hip Special Tests    Hip Special Tests   Trendelenberg Test      Trendelenburg Test   Findings  Negative    Side  Left;Right                Pelvic Floor Special Questions - 07/18/19 0001    Pelvic Floor Internal Exam  Patient confirms identification and  approves PT treatment and assessment    Exam Type  Vaginal    Palpation  tenderness located on left levator and vaginal canal was shorter but after soft tissue work elongated    Strength  good squeeze, good lift, able to hold agaisnt strong resistance        [facility] Adult PT Treatment/Exercise - 07/18/19 0001      Self-Care   Self-Care  Other Self-Care Comments    Other Self-Care Comments   education on using adilator to elongat eht vaginal canal to decrease the tightness during deep penetration      Neuro Re-ed    Neuro Re-ed Details   pelvic floor contraction in sitting for 10 sec, stand for 5 sec, contract with jump, contract with sitting and cough, standing with contraction      Manual Therapy   Manual Therapy  Internal Pelvic Floor    Internal Pelvic Floor  soft tissue work to left levator ani             PT Education - 07/18/19 1622    Education Details  pelvic floor contraction with sitting,  standing, and jumping, education on vaginal dilator to elongate the vaginal canal    Person(s) Educated  Patient    Methods  Explanation;Demonstration;Verbal cues;Handout    Comprehension  Returned demonstration;Verbalized understanding       PT Short Term Goals - 06/23/19 1438      PT SHORT TERM GOAL #2   Title  full mobilty of diaphragm and abdominal scar to perform diaphragmatic breathing    Time  4    Period  Weeks    Status  Achieved    Target Date  01/31/19      PT SHORT TERM GOAL #3   Title  understand how to perform scar massage to improve lower abdominal tissue mobility    Time  4    Period  Weeks    Status  Achieved      PT SHORT TERM GOAL #4   Title  abilty to cough and not push therapist finger out of the vaginal canal due to improve pelvic floor coordination    Time  4    Period  Weeks    Target Date  01/31/19        PT Long Term Goals - 07/18/19 1549      PT LONG TERM GOAL #2   Title  urinary leakage with coughing, sneezing, and laughing is none due to pelvic floor strength increased with  15 second hold    Baseline  3 times had a little leak and not have to change clothes    Time  12    Period  Weeks    Status  On-going      PT LONG TERM GOAL #3   Title  increased strength of hips and abdominal to reduce trendelenberg to negative    Time  12    Period  Weeks    Status  Achieved      PT LONG TERM GOAL #4   Title  ability to fully empty her bladder due to ability to fully relax the pelvic floor     Time  12    Period  Weeks    Status  On-going            Plan - 07/18/19 1548    Clinical Impression Statement  Patient has only leaked 3 times since last visit and it was with jumping, sneezing and laughing. Patient pelvic floor strength increased to 4/5. Patient has increased strength with no trendelenberg on either side. Patient reports she has difficulty with deeper penetration due to the area feeling tight and educated her on a dilator to elongate the tissue. Patient will benefit from skilled therapy to improve pelvic floor coordination with activity to reduce urinary leakage.    Personal Factors and Comorbidities  Comorbidity 3+    Comorbidities  Myomectomy, c-section 2 times; Laparscopic partial Hysterectomy; Menopaussal    Examination-Activity Limitations  Continence;Toileting    Stability/Clinical Decision Making  Stable/Uncomplicated    Rehab Potential  Excellent    PT Frequency  1x / week    PT Duration  12 weeks    PT Treatment/Interventions  Biofeedback;Therapeutic activities;Therapeutic exercise;Neuromuscular re-education;Manual techniques;Patient/family education;Passive range of motion;Dry needling    PT Next Visit Plan  pelvic floor contraction , sitting 15 seconds and standing 10 seconds, sneeze in standing    Consulted and Agree with Plan of Care  Patient       Patient will benefit from skilled therapeutic intervention in order to improve the following deficits and impairments:  Increased  fascial restricitons, Decreased coordination, Decreased scar  mobility, Increased muscle spasms, Decreased activity tolerance, Decreased endurance, Decreased strength  Visit Diagnosis: Muscle weakness (generalized)  Other lack of coordination     Problem List Patient Active Problem List   Diagnosis Date Noted  . Hypotension 01/21/2012    Class: Acute  . Sepsis(995.91) 01/18/2012  . Nausea and vomiting 01/18/2012  . Leukocytosis 01/18/2012  . Headache(784.0) 01/18/2012  . Iron deficiency anemia 01/12/2011  . Reactive depression (situational) 01/12/2011  . History of UTI 01/12/2011  . Insomnia 01/12/2011  . Restless leg syndrome 01/12/2011  . Hyperlipemia 01/12/2011    Earlie Counts, PT 07/18/19 4:29 PM   [facility] [address], [place], [place], [address] Phone: [phone]   Fax:  [phone]  Name: Megan Martin MRN: [id number] Date of Birth: [birth year]  PHYSICAL THERAPY DISCHARGE SUMMARY Visits from Start of Care: 5  Current functional level related to goals / functional outcomes: See above.    Remaining deficits: See above.    Education / Equipment: HEP  Plan:                                                    Patient goals were partially met. Patient is being discharged due to not returning since the last visit.  Thank you for referral. Earlie Counts, PT 09/12/19 9:26 AM  ?????

## 2019-07-20 DIAGNOSIS — M6702 Short Achilles tendon (acquired), left ankle: Secondary | ICD-10-CM | POA: Diagnosis not present

## 2019-07-20 DIAGNOSIS — M67969 Unspecified disorder of synovium and tendon, unspecified lower leg: Secondary | ICD-10-CM | POA: Diagnosis not present

## 2019-07-23 DIAGNOSIS — Z23 Encounter for immunization: Secondary | ICD-10-CM | POA: Diagnosis not present

## 2019-07-28 MED FILL — clonazePAM 0.5 MG TABS: 0.5 | 30 days supply | Qty: 60 | Fill #1

## 2019-07-28 MED FILL — rOPINIRole HCL 0.5 MG TABS: 0.5 | 30 days supply | Qty: 30 | Fill #4

## 2019-08-02 DIAGNOSIS — M67969 Unspecified disorder of synovium and tendon, unspecified lower leg: Secondary | ICD-10-CM | POA: Diagnosis not present

## 2019-08-02 DIAGNOSIS — M6702 Short Achilles tendon (acquired), left ankle: Secondary | ICD-10-CM | POA: Diagnosis not present

## 2019-08-03 DIAGNOSIS — Z0000 Encounter for general adult medical examination without abnormal findings: Secondary | ICD-10-CM | POA: Diagnosis not present

## 2019-08-03 DIAGNOSIS — M859 Disorder of bone density and structure, unspecified: Secondary | ICD-10-CM | POA: Diagnosis not present

## 2019-08-03 DIAGNOSIS — E7849 Other hyperlipidemia: Secondary | ICD-10-CM | POA: Diagnosis not present

## 2019-08-04 DIAGNOSIS — M67969 Unspecified disorder of synovium and tendon, unspecified lower leg: Secondary | ICD-10-CM | POA: Diagnosis not present

## 2019-08-08 DIAGNOSIS — M6702 Short Achilles tendon (acquired), left ankle: Secondary | ICD-10-CM | POA: Diagnosis not present

## 2019-08-08 DIAGNOSIS — M67969 Unspecified disorder of synovium and tendon, unspecified lower leg: Secondary | ICD-10-CM | POA: Diagnosis not present

## 2019-08-10 DIAGNOSIS — Z86010 Personal history of colonic polyps: Secondary | ICD-10-CM | POA: Diagnosis not present

## 2019-08-10 DIAGNOSIS — M25572 Pain in left ankle and joints of left foot: Secondary | ICD-10-CM | POA: Diagnosis not present

## 2019-08-10 DIAGNOSIS — E785 Hyperlipidemia, unspecified: Secondary | ICD-10-CM | POA: Diagnosis not present

## 2019-08-10 DIAGNOSIS — Z1331 Encounter for screening for depression: Secondary | ICD-10-CM | POA: Diagnosis not present

## 2019-08-10 DIAGNOSIS — H9191 Unspecified hearing loss, right ear: Secondary | ICD-10-CM | POA: Diagnosis not present

## 2019-08-10 DIAGNOSIS — G43909 Migraine, unspecified, not intractable, without status migrainosus: Secondary | ICD-10-CM | POA: Diagnosis not present

## 2019-08-10 DIAGNOSIS — R82998 Other abnormal findings in urine: Secondary | ICD-10-CM | POA: Diagnosis not present

## 2019-08-10 DIAGNOSIS — G4700 Insomnia, unspecified: Secondary | ICD-10-CM | POA: Diagnosis not present

## 2019-08-10 DIAGNOSIS — L210 Seborrhea capitis: Secondary | ICD-10-CM | POA: Diagnosis not present

## 2019-08-10 DIAGNOSIS — Z0000 Encounter for general adult medical examination without abnormal findings: Secondary | ICD-10-CM | POA: Diagnosis not present

## 2019-08-10 DIAGNOSIS — G2581 Restless legs syndrome: Secondary | ICD-10-CM | POA: Diagnosis not present

## 2019-08-10 DIAGNOSIS — R400 Somnolence: Secondary | ICD-10-CM | POA: Diagnosis not present

## 2019-08-17 DIAGNOSIS — M67969 Unspecified disorder of synovium and tendon, unspecified lower leg: Secondary | ICD-10-CM | POA: Diagnosis not present

## 2019-08-19 ENCOUNTER — Encounter: Payer: 59 | Admitting: Physical Therapy

## 2019-08-22 ENCOUNTER — Encounter: Payer: Self-pay | Admitting: Neurology

## 2019-08-22 ENCOUNTER — Ambulatory Visit: Payer: 59 | Admitting: Neurology

## 2019-08-22 ENCOUNTER — Other Ambulatory Visit: Payer: Self-pay

## 2019-08-22 VITALS — BP 114/74 | HR 83 | Ht 62.0 in | Wt 159.0 lb

## 2019-08-22 DIAGNOSIS — Z820 Family history of epilepsy and other diseases of the nervous system: Secondary | ICD-10-CM

## 2019-08-22 DIAGNOSIS — G2581 Restless legs syndrome: Secondary | ICD-10-CM

## 2019-08-22 DIAGNOSIS — R519 Headache, unspecified: Secondary | ICD-10-CM | POA: Diagnosis not present

## 2019-08-22 DIAGNOSIS — E663 Overweight: Secondary | ICD-10-CM | POA: Diagnosis not present

## 2019-08-22 DIAGNOSIS — G4719 Other hypersomnia: Secondary | ICD-10-CM

## 2019-08-22 NOTE — Progress Notes (Signed)
Subjective:    Patient ID: Megan Martin is a 58 y.o. female.  HPI     Star Age, MD, PhD [facility] [address], [address] P.O. [place], [place] [address]  Dear Dr. Brigitte Pulse,   I saw your patient, Megan Martin, upon your kind request in my sleep clinic today for initial consultation of her sleep disorder, in particular, her daytime somnolence.  The patient is unaccompanied today.  As you know, [name] is a 58 year old right-handed woman with an underlying medical history of migraine headaches, depression, anemia, osteopenia and overweight state, who reports recurrent morning headaches and daytime somnolence. She is unsure if she snores, husband does not complain about it.  She reports that he is a very heavy sleeper. I reviewed your office note from 08/10/2019, which you kindly included.  Her Epworth sleepiness score is 18 out of 24, fatigue severity score is 60 out of 63.  She has had daytime somnolence for the past few years, feels like this is getting worse over the past 2 years.  She has no lifelong history of excessive daytime somnolence, reports a family history of sleep apnea in her brother who passed away at the age of 55 from a massive heart attack as I understand.  He was not on CPAP therapy but was diagnosed with sleep apnea, her mother has a CPAP machine and uses it faithfully.  The patient lives with her husband and 4 foster children, in the process of adoption.  Their ages are 2, 61, 62, and 28-year-old.  She has 2 older biological children, ages 62 and 54, 4 grandchildren, and 2 stepchildren, ages 61 and 48.  The 76-year-old toddler sleeps in a toddler bed in their bedroom.  The other children sleep in their bedrooms.  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  She does not watch TV in her bedroom and sleeps with a white  noise machine and a sleep mask on.  She keeps her bedroom dark and cool.  She drinks caffeine occasionally, not daily, typically in the form of tea or soda, she has a longer standing history of difficulty initiating and maintaining sleep, has been happening for years, has tried and failed multiple sleep aids including Ambien which caused side effects and trazodone.  She has been using a bite guard from her dentist for tooth grinding, she has been on Requip for restless leg symptoms.  Bedtime and rise time are fairly scheduled.  She is a non-smoker and does not utilize alcohol.  She denies night to night nocturia but has had recurrent morning headaches.  Her Past Medical History Is Significant For: Past Medical History:  Diagnosis Date  . Anemia   . Complication of anesthesia    Pt states she gets post-op N&V,dehydration,decreased B/P past sedation.She denies allergy to soy and eggs  . Depression   . Insomnia   . Migraine   . S/P partial hysterectomy   . UTI (lower urinary tract infection)     Her Past Surgical History Is Significant For: Past Surgical History:  Procedure Laterality Date  . ABDOMINAL HYSTERECTOMY    . BLEPHAROPLASTY     bil lower  . CESAREAN SECTION  1982, 1984  . LAPAROSCOPIC HYSTERECTOMY     still has cervix, ovaries, 1 tube  . MYOMECTOMY    . sinus irrigations  two times /1968  . TONSILLECTOMY    . WISDOM TOOTH EXTRACTION      Her Family History Is Significant For: Family History  Problem Relation Age of Onset  . Heart disease Mother   . Hypertension Mother   . Heart disease Father   . Early death Father   . Hypertension Brother   . Hyperlipidemia Brother   . Heart disease Brother   . Colon cancer Paternal Uncle     Her Social History Is Significant For: Social History   Socioeconomic History  . Marital status: Married    Spouse name: Not on file  . Number of children: Not on file  . Years of education: Not on file  . Highest education level:  Not on file  Occupational History  . Not on file  Social Needs  . Financial resource strain: Not on file  . Food insecurity    Worry: Not on file    Inability: Not on file  . Transportation needs    Medical: Not on file    Non-medical: Not on file  Tobacco Use  . Smoking status: Never Smoker  . Smokeless tobacco: Never Used  Substance and Sexual Activity  . Alcohol use: No  . Drug use: No  . Sexual activity: Not Currently  Lifestyle  . Physical activity    Days per week: Not on file    Minutes per session: Not on file  . Stress: Not on file  Relationships  . Social Herbalist on phone: Not on file    Gets together: Not on file    Attends religious service: Not on file    Active member of club or organization: Not on file    Attends meetings of clubs or organizations: Not on file    Relationship status: Not on file  Other Topics Concern  . 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 Is active, ambulatory without cane or walker. Divorced, has two children.     Her Allergies Are:  Allergies  Allergen Reactions  . Demerol Other (See Comments)    Swelling of arm  . Reglan [Metoclopramide]     Agitation and anxiousness  :   Her Current Medications Are:  Outpatient Encounter Medications as of 08/22/2019  Medication Sig  . clonazePAM (KLONOPIN) 0.5 MG tablet Take 0.5-1 mg by mouth at bedtime.   Marland Kitchen estradiol (VIVELLE-DOT) 0.1 MG/24HR Place 1 patch onto the skin 2 (two) times a week.    . ondansetron (ZOFRAN ODT) 4 MG disintegrating tablet 4mg  ODT q4 hours prn nausea/vomit  . PRISTIQ 50 MG 24 hr tablet TAKE 1 TABLET BY MOUTH DAILY (Patient taking differently: TAKE 1 TABLET BY MOUTH AT BEDTIME)  . rOPINIRole (REQUIP) 0.5 MG tablet Take 0.5 mg by mouth at bedtime.  . [DISCONTINUED] calcium-vitamin D (OSCAL WITH D) 500-200 MG-UNIT per tablet Take 1 tablet by mouth daily.  . [DISCONTINUED]  cholecalciferol (VITAMIN D) 1000 UNITS tablet Take by mouth daily. Take 5000 units daily  . [DISCONTINUED] naproxen (NAPROSYN) 375 MG tablet Take 1 tablet (375 mg total) by mouth 2 (two) times daily. (Patient not taking: Reported on 01/03/2019)   No facility-administered encounter medications on file as of 08/22/2019.   :  Review of Systems:  Out of a complete 14 point review of systems, all are reviewed and negative with the exception of these symptoms as listed below: Review of Systems  Neurological:       Pt presents today to discuss her sleep. Pt has never had a sleep study and is unsure if she snores.  Epworth Sleepiness Scale 0= would never doze 1= slight chance of dozing 2= moderate chance of dozing 3= high chance of dozing  Sitting and reading: 3 Watching TV: 2 Sitting inactive in a public place (ex. Theater or meeting): 2 As a passenger in a car for an hour without a break: 3 Lying down to rest in the afternoon: 3 Sitting and talking to someone: 1 Sitting quietly after lunch (no alcohol): 3 In a car, while stopped in traffic: 1 Total: 18     Objective:  Neurological Exam  Physical Exam Physical Examination:   Vitals:   08/22/19 1101  BP: 114/74  Pulse: 83    General Examination: The patient is a very pleasant 58 y.o. female in no acute distress. She appears well-developed and well-nourished and well groomed.   HEENT: Normocephalic, atraumatic, pupils are equal, round and reactive to light and accommodation. Corrective eyeglasses in place. Funduscopic exam is normal with sharp disc margins noted. Extraocular tracking is good without limitation to gaze excursion or nystagmus noted. Normal smooth pursuit is noted. Hearing is grossly intact. Face is symmetric with normal facial animation and normal facial sensation. Speech is clear with no dysarthria noted. There is no hypophonia. There is no lip, neck/head, jaw or voice tremor. Neck is supple with full range of passive  and active motion. There are no carotid bruits on auscultation. Oropharynx exam reveals: mild mouth dryness, good dental hygiene and mild airway crowding, due to Small airway entry and slightly prominent appearing uvula, Mallampati is class II, neck circumference is 14-1/8 inches, she has a minimal overbite. Tongue protrudes centrally and palate elevates symmetrically.   Chest: Clear to auscultation without wheezing, rhonchi or crackles noted.  Heart: S1+S2+0, regular and normal without murmurs, rubs or gallops noted.   Abdomen: Soft, non-tender and non-distended with normal bowel sounds appreciated on auscultation.  Extremities: There is no pitting edema in the distal lower extremities bilaterally.  Skin: Warm and dry without trophic changes noted.  Musculoskeletal: exam reveals no obvious joint deformities, tenderness or joint swelling or erythema.   Neurologically:  Mental status: The patient is awake, alert and oriented in all 4 spheres. Her immediate and remote memory, attention, language skills and fund of knowledge are appropriate. There is no evidence of aphasia, agnosia, apraxia or anomia. Speech is clear with normal prosody and enunciation. Thought process is linear. Mood is normal and affect is normal.  Cranial nerves II - XII are as described above under HEENT exam. In addition: shoulder shrug is normal with equal shoulder height noted. Motor exam: Normal bulk, strength and tone is noted. There is no drift, tremor or rebound. Romberg is negative. Reflexes are 2+ throughout. Fine motor skills and coordination: intact grossly.  Cerebellar testing: No dysmetria or intention tremor on finger to nose testing. Heel to shin is unremarkable bilaterally. There is no truncal or gait ataxia.  Sensory exam: intact to light touch in the upper and lower extremities.  Gait, station and balance: She stands easily. No veering to one side is noted. No leaning to one side is noted. Posture is  age-appropriate and stance is narrow based. Gait shows normal stride length and normal pace. No problems turning are noted. Tandem walk is unremarkable.           Assessment and Plan:  In summary, Kaena  Leonard President is a very pleasant 58 y.o.-year old female with an underlying medical history of migraine headaches, depression, anemia, osteopenia and overweight state, who Presents for evaluation of her sleep disorder.  She has chronic difficulty initiating and maintaining sleep, she is sleepy during the day, reports a family history of OSA and recurrent morning headaches, her history and examination are concerning for underlying sleep disordered breathing.  If she has no sleep apnea, we can consider proceeding with a more extended sleep study testing to evaluate for an underlying hypersomnolence disorder. I talked to her at length about sleep apnea, its prognosis and treatment options.  She is familiar with the CPAP therapy and would be willing to consider it.  She may or may not be a candidate for dental device given her crowns.  Nevertheless, we will proceed with diagnostic testing and consider therapy for sleep disordered breathing. I explained the sleep test procedure to the patient and also outlined possible surgical and non-surgical treatment options of OSA, including the use of a custom-made dental device (which would require a referral to a specialist dentist or oral surgeon), upper airway surgical options, such as traditional UPPP or a novel less invasive surgical option in the form of Inspire hypoglossal nerve stimulation (which would involve a referral to an ENT surgeon). I Plan to see her back after testing.  I answered all her questions today and she was in agreement. Thank you very much for allowing me to participate in the care of this nice patient. If I can be of any further assistance to you please do not hesitate to call me at [phone].  Sincerely,   Star Age, MD, PhD

## 2019-08-22 NOTE — Patient Instructions (Signed)
Thank you for choosing [facility] for your sleep related care! It was nice to meet you today! I appreciate that you entrust me with your sleep related healthcare concerns. I hope, I was able to address at least some of your concerns today, and that I can help you feel reassured and also get better.    Here is what we discussed today and what we came up with as our plan for you:    Based on your symptoms and your exam I believe you are at risk for obstructive sleep apnea (aka OSA), and I think we should proceed with a sleep study to determine whether you do or do not have OSA and how severe it is. Even, if you have mild OSA, I may want you to consider treatment with CPAP, as treatment of even borderline or mild sleep apnea can result and improvement of symptoms such as sleep disruption, daytime sleepiness, nighttime bathroom breaks, restless leg symptoms, improvement of headache syndromes, even improved mood disorder.   Please remember, the long-term risks and ramifications of untreated moderate to severe obstructive sleep apnea are: increased Cardiovascular disease, including congestive heart failure, stroke, difficult to control hypertension, treatment resistant obesity, arrhythmias, especially irregular heartbeat commonly known as A. Fib. (atrial fibrillation); even type 2 diabetes has been linked to untreated OSA.   Sleep apnea can cause disruption of sleep and sleep deprivation in most cases, which, in turn, can cause recurrent headaches, problems with memory, mood, concentration, focus, and vigilance. Most people with untreated sleep apnea report excessive daytime sleepiness, which can affect their ability to drive. Please do not drive if you feel sleepy. Patients with sleep apnea developed difficulty initiating and maintaining sleep (aka insomnia).   Having sleep apnea may increase your risk for other sleep disorders, including involuntary behaviors sleep such as sleep terrors,  sleep talking, sleepwalking.    Having sleep apnea can also increase your risk for restless leg syndrome and leg movements at night.   Please note that untreated obstructive sleep apnea may carry additional perioperative morbidity. Patients with significant obstructive sleep apnea (typically, in the moderate to severe degree) should receive, if possible, perioperative PAP (positive airway pressure) therapy and the surgeons and particularly the anesthesiologists should be informed of the diagnosis and the severity of the sleep disordered breathing.    I plan to see you back after your sleep study to go over the test results and where to go from there. We will call you after your sleep study to advise about the results (most likely, you will hear from Slick, my nurse) and to set up an appointment at the time, as necessary.    If your sleep test does not reveal any obstructive sleep apnea, we can consider extended sleep testing in the form of nighttime sleep study with next day nap testing. As discussed, you would have to taper your Requip, Pristiq and clonazepam in preparation for extended sleep study testing to look for something like narcolepsy.   Our sleep lab administrative assistant will call you to schedule your sleep study and give you further instructions, regarding the check in process for the sleep study, arrival time, what to bring, when you can expect to leave after the study, etc., and to answer any other logistical questions you may have. If you don't hear back from her by about 2 weeks from now, please feel free to call her direct line at [phone] or you can call our general clinic number, or email  [place] through My Chart.

## 2019-08-23 DIAGNOSIS — H5213 Myopia, bilateral: Secondary | ICD-10-CM | POA: Diagnosis not present

## 2019-08-23 DIAGNOSIS — H524 Presbyopia: Secondary | ICD-10-CM | POA: Diagnosis not present

## 2019-08-23 DIAGNOSIS — H52223 Regular astigmatism, bilateral: Secondary | ICD-10-CM | POA: Diagnosis not present

## 2019-08-24 MED FILL — rOPINIRole HCL 0.5 MG TABS: 0.5 | 30 days supply | Qty: 30 | Fill #0

## 2019-08-25 MED FILL — clonazePAM 0.5 MG TABS: 0.5 | 30 days supply | Qty: 60 | Fill #2

## 2019-08-26 DIAGNOSIS — L578 Other skin changes due to chronic exposure to nonionizing radiation: Secondary | ICD-10-CM | POA: Diagnosis not present

## 2019-08-26 DIAGNOSIS — D225 Melanocytic nevi of trunk: Secondary | ICD-10-CM | POA: Diagnosis not present

## 2019-08-26 DIAGNOSIS — D485 Neoplasm of uncertain behavior of skin: Secondary | ICD-10-CM | POA: Diagnosis not present

## 2019-08-26 DIAGNOSIS — L219 Seborrheic dermatitis, unspecified: Secondary | ICD-10-CM | POA: Diagnosis not present

## 2019-08-26 DIAGNOSIS — L820 Inflamed seborrheic keratosis: Secondary | ICD-10-CM | POA: Diagnosis not present

## 2019-09-17 ENCOUNTER — Ambulatory Visit (INDEPENDENT_AMBULATORY_CARE_PROVIDER_SITE_OTHER): Payer: 59 | Admitting: Neurology

## 2019-09-17 ENCOUNTER — Other Ambulatory Visit: Payer: Self-pay

## 2019-09-17 DIAGNOSIS — G4720 Circadian rhythm sleep disorder, unspecified type: Secondary | ICD-10-CM

## 2019-09-17 DIAGNOSIS — G4719 Other hypersomnia: Secondary | ICD-10-CM

## 2019-09-17 DIAGNOSIS — G4761 Periodic limb movement disorder: Secondary | ICD-10-CM | POA: Diagnosis not present

## 2019-09-17 DIAGNOSIS — R519 Headache, unspecified: Secondary | ICD-10-CM

## 2019-09-17 DIAGNOSIS — G2581 Restless legs syndrome: Secondary | ICD-10-CM

## 2019-09-17 DIAGNOSIS — Z820 Family history of epilepsy and other diseases of the nervous system: Secondary | ICD-10-CM

## 2019-09-17 DIAGNOSIS — E663 Overweight: Secondary | ICD-10-CM

## 2019-09-22 MED FILL — rOPINIRole HCL 0.5 MG TABS: 0.5 | 30 days supply | Qty: 30 | Fill #1

## 2019-09-22 MED FILL — DESVENLAFAXINE SUC ER 50 MG: 50 | 90 days supply | Qty: 90 | Fill #1

## 2019-09-22 MED FILL — clonazePAM 0.5 MG TABS: 0.5 | 30 days supply | Qty: 60 | Fill #3

## 2019-09-22 MED FILL — ESTRADIOL 0.075 MG/24HR PTT: 0.075 | 28 days supply | Qty: 8 | Fill #4

## 2019-09-22 MED FILL — SUMAtriptan SUCCINATE 100 M: 100 | 30 days supply | Qty: 9 | Fill #0

## 2019-09-26 ENCOUNTER — Telehealth: Payer: Self-pay

## 2019-09-26 NOTE — Progress Notes (Signed)
Patient referred by Dr. Brigitte Pulse, seen by me on 08/22/19, diagnostic PSG on 09/17/19.   Please call and notify the patient that the recent sleep study did not show any significant obstructive sleep apnea, no significant snoring. She had some difficulty falling asleep. She had mild leg movements which did not cause sleep disruption. She is already on medication for her RLS.  No obvious cause for her daytime sleepiness, other than possible sleep deprivation and medication effect, as clonazepam and ropinirole can both be sedating.  Please remind patient to try to maintain good sleep hygiene, which means: Keep a regular sleep and wake schedule and make enough time for sleep (7 1/2 to 8 1/2 hours for the average adult), try not to exercise or have a meal within 2 hours of your bedtime, try to keep your bedroom conducive for sleep, that is, cool and dark, without light distractors such as an illuminated alarm clock, and refrain from watching TV right before sleep or in the middle of the night and do not keep the TV or radio on during the night. If a nightlight is used, have it away from the visual field. Also, try not to use or play on electronic devices at bedtime, such as your cell phone, tablet PC or laptop. If you like to read at bedtime on an electronic device, try to dim the background light as much as possible. Do not eat in the middle of the night. Keep pets away from the bedroom environment. For stress relief, try meditation, deep breathing exercises (there are many books and CDs available), a white noise machine or fan can help to diffuse other noise distractors, such as traffic noise. Do not drink alcohol before bedtime, as it can disturb sleep and cause middle of the night awakenings. Never mix alcohol and sedating medications! Avoid narcotic pain medication close to bedtime, as opioids/narcotics can suppress breathing drive and breathing effort.   She can, at this point, FU with her Primary care.  Your  chronic insomnia, she is encouraged to consider cognitive behavioral therapy (CBT-I). I recommend, she talk to her PCP about it, who can make a referral to a psychologist.   Thanks,  Star Age, MD, PhD [facility] ([place])

## 2019-09-26 NOTE — Telephone Encounter (Signed)
I called pt and discussed her sleep study results and recommendations. Pt will follow up with Dr. Brigitte Pulse. Pt verbalized understanding of results. Pt had no questions at this time but was encouraged to call back if questions arise.

## 2019-09-26 NOTE — Procedures (Signed)
PATIENT'S NAME:  Megan Martin, Megan Martin DOB:      Mar 12, 1961      MR#:    [id number]     DATE OF RECORDING: 09/17/2019 REFERRING M.D.:  Assunta Curtis, MD Study Performed:   Baseline Polysomnogram HISTORY: 58 year old woman with a history of migraine headaches, depression, anemia, restless legs syndrome, osteopenia and overweight state, who reports recurrent morning headaches and daytime somnolence. The patient endorsed the Epworth Sleepiness Scale at 18 points. The patient's weight 157 pounds with a height of 62 (inches), resulting in a BMI of 28.8 kg/m2. The patient's neck circumference measured 14.2 inches.  CURRENT MEDICATIONS: Klonopin, estradiol, Zofran, Pristiq, Requip   PROCEDURE:  This is a multichannel digital polysomnogram utilizing the Somnostar 11.2 system.  Electrodes and sensors were applied and monitored per AASM Specifications.   EEG, EOG, Chin and Limb EMG, were sampled at 200 Hz.  ECG, Snore and Nasal Pressure, Thermal Airflow, Respiratory Effort, CPAP Flow and Pressure, Oximetry was sampled at 50 Hz. Digital video and audio were recorded.      BASELINE STUDY  Lights Out was at 21:33 and Lights On at 04:37.  Total recording time (TRT) was 425 minutes, with a total sleep time (TST) of 319.5 minutes.   The patient's sleep latency was 75.5 minutes, which is delayed. REM latency was 149.5 minutes, which is delayed. The sleep efficiency was 75.2%.     SLEEP ARCHITECTURE: WASO (Wake after sleep onset) was 22 minutes, with mild to moderate sleep fragmentation noted. There were 20.5 minutes in Stage N1, 223 minutes Stage N2, 28 minutes Stage N3 and 48 minutes in Stage REM.  The percentage of Stage N1 was 6.4%, Stage N2 was 69.8%, which is increased, Stage N3 was 8.8% and Stage R (REM sleep) was 15.%, which is mildly reduced. Alpha intrusion in N3 sleep was noted (could be medication effect from Klonopin).  RESPIRATORY ANALYSIS:  There were a total of 11 respiratory events:  0 obstructive apneas, 0  central apneas and 0 mixed apneas with a total of 0 apneas and an apnea index (AI) of 0 /hour. There were 11 hypopneas with a hypopnea index of 2.1 /hour. The patient also had 0 respiratory event related arousals (RERAs).      The total APNEA/HYPOPNEA INDEX (AHI) was 2.1 /hour and the total RESPIRATORY DISTURBANCE INDEX was 0. 2.1 /hour.  0 events occurred in REM sleep and 22 events in NREM. The REM AHI was 0 /hour, versus a non-REM AHI of 2.4 /hour. The patient spent 156 minutes of total sleep time in the supine position and 164 minutes in non-supine.. The supine AHI was 3.5/hour versus a non-supine AHI of 0.7/hour.  OXYGEN SATURATION & C02:  The Wake baseline 02 saturation was 97%, with the lowest being 88%. Time spent below 89% saturation equaled 0 minutes.  PERIODIC LIMB MOVEMENTS: The patient had a total of 115 Periodic Limb Movements.  The Periodic Limb Movement (PLM) index was 21.6 and the PLM Arousal index was 0/hour.   Audio and video analysis did not show any abnormal or unusual movements, behaviors, phonations or vocalizations. The patient took no bathroom breaks. No significant snoring was noted. The EKG was in keeping with normal sinus rhythm (NSR).  Post-study, the patient indicated that sleep was better than usual.   IMPRESSION:  1. Periodic Limb Movement Disorder (PLMD) 2. Dysfunctions associated with sleep stages or arousal from sleep  RECOMMENDATIONS:  1. This study does not demonstrate any significant obstructive or central sleep disordered  breathing.  2. Mild PLMs (periodic limb movements of sleep) were noted during this study with no significant arousals; clinical correlation is recommended. Medication effect from the antidepressant medication should be considered. The patient is on ropinirole for her RLS. No parasomnias were noted.  3. This study shows sleep fragmentation and abnormal sleep stage percentages; these are nonspecific findings and per se do not signify an  intrinsic sleep disorder or a cause for the patient's sleep-related symptoms. Causes include (but are not limited to) the first night effect of the sleep study, circadian rhythm disturbances, medication effect or an underlying mood disorder or medical problem.  4. The patient should be cautioned not to drive, work at heights, or operate dangerous or heavy equipment when tired or sleepy. Review and reiteration of good sleep hygiene measures should be pursued with any patient. 5. The patient can follow up with her referring provider, who will be notified of the test results.  I certify that I have reviewed the entire raw data recording prior to the issuance of this report in accordance with the Standards of Accreditation of the American Academy of Sleep Medicine (AASM)   Star Age, MD, PhD Diplomat, American Board of Neurology and Sleep Medicine (Neurology and Sleep Medicine)

## 2019-09-26 NOTE — Telephone Encounter (Signed)
-----   Message from Star Age, MD sent at 09/26/2019  8:29 AM EST ----- Patient referred by Dr. Brigitte Pulse, seen by me on 08/22/19, diagnostic PSG on 09/17/19.   Please call and notify the patient that the recent sleep study did not show any significant obstructive sleep apnea, no significant snoring. She had some difficulty falling asleep. She had mild leg movements which did not cause sleep disruption. She is already on medication for her RLS.  No obvious cause for her daytime sleepiness, other than possible sleep deprivation and medication effect, as clonazepam and ropinirole can both be sedating.  Please remind patient to try to maintain good sleep hygiene, which means: Keep a regular sleep and wake schedule and make enough time for sleep (7 1/2 to 8 1/2 hours for the average adult), try not to exercise or have a meal within 2 hours of your bedtime, try to keep your bedroom conducive for sleep, that is, cool and dark, without light distractors such as an illuminated alarm clock, and refrain from watching TV right before sleep or in the middle of the night and do not keep the TV or radio on during the night. If a nightlight is used, have it away from the visual field. Also, try not to use or play on electronic devices at bedtime, such as your cell phone, tablet PC or laptop. If you like to read at bedtime on an electronic device, try to dim the background light as much as possible. Do not eat in the middle of the night. Keep pets away from the bedroom environment. For stress relief, try meditation, deep breathing exercises (there are many books and CDs available), a white noise machine or fan can help to diffuse other noise distractors, such as traffic noise. Do not drink alcohol before bedtime, as it can disturb sleep and cause middle of the night awakenings. Never mix alcohol and sedating medications! Avoid narcotic pain medication close to bedtime, as opioids/narcotics can suppress breathing drive and breathing  effort.   She can, at this point, FU with her Primary care.  Your chronic insomnia, she is encouraged to consider cognitive behavioral therapy (CBT-I). I recommend, she talk to her PCP about it, who can make a referral to a psychologist.   Thanks,  Star Age, MD, PhD [facility]

## 2019-09-30 DIAGNOSIS — H93291 Other abnormal auditory perceptions, right ear: Secondary | ICD-10-CM | POA: Diagnosis not present

## 2019-09-30 DIAGNOSIS — H6123 Impacted cerumen, bilateral: Secondary | ICD-10-CM | POA: Diagnosis not present

## 2019-10-26 MED FILL — rOPINIRole HCL 0.5 MG TABS: 0.5 | 30 days supply | Qty: 30 | Fill #2

## 2019-10-26 MED FILL — CLINDAMYCIN PHOS-BENZOYL PE: 1-5 | 30 days supply | Qty: 25 | Fill #4

## 2019-10-26 MED FILL — SUMAtriptan SUCCINATE 100 M: 100 | 30 days supply | Qty: 9 | Fill #0

## 2019-11-01 MED FILL — clonazePAM 0.5 MG TABS: 0.5 | 30 days supply | Qty: 60 | Fill #0

## 2019-11-03 DIAGNOSIS — Z01419 Encounter for gynecological examination (general) (routine) without abnormal findings: Secondary | ICD-10-CM | POA: Diagnosis not present

## 2019-11-03 DIAGNOSIS — Z6829 Body mass index (BMI) 29.0-29.9, adult: Secondary | ICD-10-CM | POA: Diagnosis not present

## 2019-11-03 DIAGNOSIS — Z1231 Encounter for screening mammogram for malignant neoplasm of breast: Secondary | ICD-10-CM | POA: Diagnosis not present

## 2019-11-03 MED FILL — ESTRADIOL 0.075 MG/24HR PTT: 0.075 | 28 days supply | Qty: 8 | Fill #0

## 2019-11-14 DIAGNOSIS — M7662 Achilles tendinitis, left leg: Secondary | ICD-10-CM | POA: Diagnosis not present

## 2019-11-14 DIAGNOSIS — M7661 Achilles tendinitis, right leg: Secondary | ICD-10-CM | POA: Diagnosis not present

## 2019-11-14 MED FILL — MELOXICAM 7.5 MG TABLET: 7.5 | 30 days supply | Qty: 60 | Fill #0

## 2019-11-18 DIAGNOSIS — M7661 Achilles tendinitis, right leg: Secondary | ICD-10-CM | POA: Diagnosis not present

## 2019-11-18 DIAGNOSIS — M7662 Achilles tendinitis, left leg: Secondary | ICD-10-CM | POA: Diagnosis not present

## 2019-11-21 DIAGNOSIS — M7661 Achilles tendinitis, right leg: Secondary | ICD-10-CM | POA: Diagnosis not present

## 2019-11-21 DIAGNOSIS — M7662 Achilles tendinitis, left leg: Secondary | ICD-10-CM | POA: Diagnosis not present

## 2019-11-24 DIAGNOSIS — M7661 Achilles tendinitis, right leg: Secondary | ICD-10-CM | POA: Diagnosis not present

## 2019-11-24 DIAGNOSIS — M7662 Achilles tendinitis, left leg: Secondary | ICD-10-CM | POA: Diagnosis not present

## 2019-11-25 DIAGNOSIS — J019 Acute sinusitis, unspecified: Secondary | ICD-10-CM | POA: Diagnosis not present

## 2019-11-25 DIAGNOSIS — J3489 Other specified disorders of nose and nasal sinuses: Secondary | ICD-10-CM | POA: Diagnosis not present

## 2019-11-25 DIAGNOSIS — Z1152 Encounter for screening for COVID-19: Secondary | ICD-10-CM | POA: Diagnosis not present

## 2019-11-25 DIAGNOSIS — G43C0 Periodic headache syndromes in child or adult, not intractable: Secondary | ICD-10-CM | POA: Diagnosis not present

## 2019-11-25 MED FILL — AZITHROMYCIN 250 MG TABLET: 250 | 5 days supply | Qty: 6 | Fill #0

## 2019-11-30 DIAGNOSIS — M7661 Achilles tendinitis, right leg: Secondary | ICD-10-CM | POA: Diagnosis not present

## 2019-11-30 DIAGNOSIS — M7662 Achilles tendinitis, left leg: Secondary | ICD-10-CM | POA: Diagnosis not present

## 2019-11-30 MED FILL — ESTRADIOL 0.075 MG/24HR PTT: 0.075 | 28 days supply | Qty: 8 | Fill #1

## 2019-11-30 MED FILL — rOPINIRole HCL 0.5 MG TABS: 0.5 | 30 days supply | Qty: 30 | Fill #3

## 2019-11-30 MED FILL — SUMAtriptan SUCCINATE 100 M: 100 | 10 days supply | Qty: 3 | Fill #1

## 2019-12-01 MED FILL — clonazePAM 0.5 MG TABS: 0.5 | 30 days supply | Qty: 60 | Fill #1

## 2019-12-02 DIAGNOSIS — M7662 Achilles tendinitis, left leg: Secondary | ICD-10-CM | POA: Diagnosis not present

## 2019-12-02 DIAGNOSIS — M7661 Achilles tendinitis, right leg: Secondary | ICD-10-CM | POA: Diagnosis not present

## 2019-12-07 DIAGNOSIS — M7661 Achilles tendinitis, right leg: Secondary | ICD-10-CM | POA: Diagnosis not present

## 2019-12-07 DIAGNOSIS — M7662 Achilles tendinitis, left leg: Secondary | ICD-10-CM | POA: Diagnosis not present

## 2019-12-09 DIAGNOSIS — M7661 Achilles tendinitis, right leg: Secondary | ICD-10-CM | POA: Diagnosis not present

## 2019-12-09 DIAGNOSIS — M7662 Achilles tendinitis, left leg: Secondary | ICD-10-CM | POA: Diagnosis not present

## 2019-12-14 DIAGNOSIS — M7662 Achilles tendinitis, left leg: Secondary | ICD-10-CM | POA: Diagnosis not present

## 2019-12-14 DIAGNOSIS — M7661 Achilles tendinitis, right leg: Secondary | ICD-10-CM | POA: Diagnosis not present

## 2019-12-16 DIAGNOSIS — M7662 Achilles tendinitis, left leg: Secondary | ICD-10-CM | POA: Diagnosis not present

## 2019-12-16 DIAGNOSIS — M7661 Achilles tendinitis, right leg: Secondary | ICD-10-CM | POA: Diagnosis not present

## 2019-12-19 DIAGNOSIS — M7661 Achilles tendinitis, right leg: Secondary | ICD-10-CM | POA: Diagnosis not present

## 2019-12-19 DIAGNOSIS — M7662 Achilles tendinitis, left leg: Secondary | ICD-10-CM | POA: Diagnosis not present

## 2019-12-21 DIAGNOSIS — M7662 Achilles tendinitis, left leg: Secondary | ICD-10-CM | POA: Diagnosis not present

## 2019-12-21 DIAGNOSIS — M7661 Achilles tendinitis, right leg: Secondary | ICD-10-CM | POA: Diagnosis not present

## 2019-12-27 MED FILL — DESVENLAFAXINE SUC ER 50 MG: 50 | 90 days supply | Qty: 90 | Fill #2

## 2019-12-27 MED FILL — rOPINIRole HCL 0.5 MG TABS: 0.5 | 30 days supply | Qty: 30 | Fill #4

## 2019-12-28 DIAGNOSIS — M7662 Achilles tendinitis, left leg: Secondary | ICD-10-CM | POA: Diagnosis not present

## 2019-12-28 DIAGNOSIS — M7661 Achilles tendinitis, right leg: Secondary | ICD-10-CM | POA: Diagnosis not present

## 2019-12-30 DIAGNOSIS — M7661 Achilles tendinitis, right leg: Secondary | ICD-10-CM | POA: Diagnosis not present

## 2019-12-30 DIAGNOSIS — M7662 Achilles tendinitis, left leg: Secondary | ICD-10-CM | POA: Diagnosis not present

## 2019-12-30 MED FILL — clonazePAM 0.5 MG TABS: 0.5 | 30 days supply | Qty: 60 | Fill #2

## 2020-01-04 DIAGNOSIS — M7662 Achilles tendinitis, left leg: Secondary | ICD-10-CM | POA: Diagnosis not present

## 2020-01-04 DIAGNOSIS — M7661 Achilles tendinitis, right leg: Secondary | ICD-10-CM | POA: Diagnosis not present

## 2020-01-06 DIAGNOSIS — M7661 Achilles tendinitis, right leg: Secondary | ICD-10-CM | POA: Diagnosis not present

## 2020-01-06 DIAGNOSIS — M7662 Achilles tendinitis, left leg: Secondary | ICD-10-CM | POA: Diagnosis not present

## 2020-01-11 DIAGNOSIS — M7662 Achilles tendinitis, left leg: Secondary | ICD-10-CM | POA: Diagnosis not present

## 2020-01-11 DIAGNOSIS — M25572 Pain in left ankle and joints of left foot: Secondary | ICD-10-CM | POA: Diagnosis not present

## 2020-01-11 DIAGNOSIS — M7661 Achilles tendinitis, right leg: Secondary | ICD-10-CM | POA: Diagnosis not present

## 2020-01-23 DIAGNOSIS — M25571 Pain in right ankle and joints of right foot: Secondary | ICD-10-CM | POA: Diagnosis not present

## 2020-01-24 DIAGNOSIS — M25572 Pain in left ankle and joints of left foot: Secondary | ICD-10-CM | POA: Diagnosis not present

## 2020-01-30 ENCOUNTER — Other Ambulatory Visit (HOSPITAL_COMMUNITY): Payer: Self-pay | Admitting: Internal Medicine

## 2020-01-30 DIAGNOSIS — M25571 Pain in right ankle and joints of right foot: Secondary | ICD-10-CM | POA: Diagnosis not present

## 2020-01-30 MED FILL — ESTRADIOL 0.075 MG/24HR PTT: 0.075 | 28 days supply | Qty: 8 | Fill #2

## 2020-01-30 MED FILL — rOPINIRole HCL 0.5 MG TABS: 0.5 | 30 days supply | Qty: 30 | Fill #0

## 2020-01-30 MED FILL — SUMAtriptan SUCCINATE 100 M: 100 | 30 days supply | Qty: 9 | Fill #0

## 2020-01-30 MED FILL — clonazePAM 0.5 MG TABS: 0.5 | 30 days supply | Qty: 60 | Fill #3

## 2020-01-30 MED FILL — DICLOFENAC SODIUM 1 % GEL: 1 | 25 days supply | Qty: 100 | Fill #0

## 2020-03-05 MED FILL — clonazePAM 0.5 MG TABS: 0.5 | 30 days supply | Qty: 60 | Fill #0

## 2020-03-07 DIAGNOSIS — L814 Other melanin hyperpigmentation: Secondary | ICD-10-CM | POA: Diagnosis not present

## 2020-03-07 DIAGNOSIS — D485 Neoplasm of uncertain behavior of skin: Secondary | ICD-10-CM | POA: Diagnosis not present

## 2020-03-30 ENCOUNTER — Other Ambulatory Visit (HOSPITAL_COMMUNITY): Payer: Self-pay | Admitting: Internal Medicine

## 2020-04-04 DIAGNOSIS — G43909 Migraine, unspecified, not intractable, without status migrainosus: Secondary | ICD-10-CM | POA: Diagnosis not present

## 2020-04-04 MED FILL — ONDANSETRON HCL 4 MG TABLET: 4 | 8 days supply | Qty: 30 | Fill #0

## 2020-04-04 MED FILL — BUTALBITAL/ACETAMINOPHEN 50: 50-325 | 5 days supply | Qty: 20 | Fill #0

## 2020-04-06 MED FILL — clonazePAM 0.5 MG TABS: 0.5 | 30 days supply | Qty: 60 | Fill #1

## 2020-04-09 ENCOUNTER — Other Ambulatory Visit (HOSPITAL_COMMUNITY): Payer: Self-pay | Admitting: Internal Medicine

## 2020-04-09 DIAGNOSIS — G43909 Migraine, unspecified, not intractable, without status migrainosus: Secondary | ICD-10-CM | POA: Diagnosis not present

## 2020-04-09 DIAGNOSIS — M7661 Achilles tendinitis, right leg: Secondary | ICD-10-CM | POA: Diagnosis not present

## 2020-04-09 MED FILL — predniSONE 10 MG TABS: 10 | 6 days supply | Qty: 21 | Fill #0

## 2020-04-09 MED FILL — NURTEC 75 MG TBDP: 75 | 30 days supply | Qty: 8 | Fill #0

## 2020-04-09 MED FILL — TOPIRAMATE 50 MG TABLET: 50 | 30 days supply | Qty: 30 | Fill #0

## 2020-05-02 MED FILL — rOPINIRole HCL 0.5 MG TABS: 0.5 | 30 days supply | Qty: 30 | Fill #3

## 2020-05-07 ENCOUNTER — Other Ambulatory Visit (HOSPITAL_COMMUNITY): Payer: Self-pay | Admitting: Obstetrics and Gynecology

## 2020-05-07 MED FILL — clonazePAM 0.5 MG TABS: 0.5 | 30 days supply | Qty: 60 | Fill #2

## 2020-05-07 MED FILL — ESTRADIOL 0.075 MG/24HR PTT: 0.075 | 28 days supply | Qty: 8 | Fill #4

## 2020-05-08 MED FILL — CLINDAMYCIN PHOS-BENZOYL PE: 1-5 | 30 days supply | Qty: 25 | Fill #0

## 2020-06-01 ENCOUNTER — Encounter: Payer: Self-pay | Admitting: Gastroenterology

## 2020-06-01 MED FILL — rOPINIRole HCL 0.5 MG TABS: 0.5 | 30 days supply | Qty: 30 | Fill #4

## 2020-06-11 MED FILL — clonazePAM 0.5 MG TABS: 0.5 | 30 days supply | Qty: 60 | Fill #3

## 2020-06-11 MED FILL — TOPIRAMATE 50 MG TABLET: 50 | 30 days supply | Qty: 30 | Fill #1

## 2020-06-26 MED FILL — DESVENLAFAXINE SUC ER 50 MG: 50 | 90 days supply | Qty: 90 | Fill #1

## 2020-07-02 MED FILL — rOPINIRole HCL 0.5 MG TABS: 0.5 | 30 days supply | Qty: 30 | Fill #5

## 2020-07-11 MED FILL — TOPIRAMATE 50 MG TABLET: 50 | 30 days supply | Qty: 30 | Fill #2

## 2020-07-11 MED FILL — clonazePAM 0.5 MG TABS: 0.5 | 30 days supply | Qty: 60 | Fill #0

## 2020-08-02 ENCOUNTER — Encounter: Payer: Self-pay | Admitting: Gastroenterology

## 2020-08-02 ENCOUNTER — Ambulatory Visit (AMBULATORY_SURGERY_CENTER): Payer: 59

## 2020-08-02 ENCOUNTER — Other Ambulatory Visit: Payer: Self-pay | Admitting: Gastroenterology

## 2020-08-02 ENCOUNTER — Other Ambulatory Visit: Payer: Self-pay

## 2020-08-02 VITALS — Ht 62.0 in | Wt 162.0 lb

## 2020-08-02 DIAGNOSIS — Z86010 Personal history of colonic polyps: Secondary | ICD-10-CM

## 2020-08-02 MED ORDER — NA SULFATE-K SULFATE-MG SULF 17.5-3.13-1.6 GM/177ML PO SOLN: 1.0000 | Freq: Once | ORAL | 0 refills | Status: DC

## 2020-08-02 MED FILL — ESTRADIOL 0.075 MG/24HR PTT: 0.075 | 28 days supply | Qty: 8 | Fill #5

## 2020-08-02 MED FILL — rOPINIRole HCL 0.5 MG TABS: 0.5 | 30 days supply | Qty: 30 | Fill #6

## 2020-08-02 MED FILL — SUPREP BOWEL PREP KIT: 17.5-3.13-1 | 1 days supply | Qty: 354 | Fill #0

## 2020-08-02 NOTE — Progress Notes (Signed)

## 2020-08-10 ENCOUNTER — Other Ambulatory Visit (HOSPITAL_COMMUNITY): Payer: Self-pay | Admitting: Internal Medicine

## 2020-08-10 MED FILL — clonazePAM 0.5 MG TABS: 0.5 | 30 days supply | Qty: 60 | Fill #0

## 2020-08-10 MED FILL — TOPIRAMATE 50 MG TABLET: 50 | 30 days supply | Qty: 30 | Fill #3

## 2020-08-16 ENCOUNTER — Encounter: Payer: 59 | Admitting: Gastroenterology

## 2020-08-29 DIAGNOSIS — E785 Hyperlipidemia, unspecified: Secondary | ICD-10-CM | POA: Diagnosis not present

## 2020-08-29 DIAGNOSIS — Z0000 Encounter for general adult medical examination without abnormal findings: Secondary | ICD-10-CM | POA: Diagnosis not present

## 2020-08-29 DIAGNOSIS — M859 Disorder of bone density and structure, unspecified: Secondary | ICD-10-CM | POA: Diagnosis not present

## 2020-08-31 ENCOUNTER — Other Ambulatory Visit (HOSPITAL_COMMUNITY): Payer: Self-pay | Admitting: Internal Medicine

## 2020-08-31 MED FILL — ESTRADIOL 0.075 MG/24HR PTT: 0.075 | 28 days supply | Qty: 8 | Fill #6

## 2020-08-31 MED FILL — rOPINIRole HCL 0.5 MG TABS: 0.5 | 30 days supply | Qty: 30 | Fill #0

## 2020-09-03 DIAGNOSIS — M8580 Other specified disorders of bone density and structure, unspecified site: Secondary | ICD-10-CM | POA: Diagnosis not present

## 2020-09-03 DIAGNOSIS — E785 Hyperlipidemia, unspecified: Secondary | ICD-10-CM | POA: Diagnosis not present

## 2020-09-03 DIAGNOSIS — G43909 Migraine, unspecified, not intractable, without status migrainosus: Secondary | ICD-10-CM | POA: Diagnosis not present

## 2020-09-03 DIAGNOSIS — R82998 Other abnormal findings in urine: Secondary | ICD-10-CM | POA: Diagnosis not present

## 2020-09-03 DIAGNOSIS — G4700 Insomnia, unspecified: Secondary | ICD-10-CM | POA: Diagnosis not present

## 2020-09-03 DIAGNOSIS — G2581 Restless legs syndrome: Secondary | ICD-10-CM | POA: Diagnosis not present

## 2020-09-03 DIAGNOSIS — Z23 Encounter for immunization: Secondary | ICD-10-CM | POA: Diagnosis not present

## 2020-09-03 DIAGNOSIS — F325 Major depressive disorder, single episode, in full remission: Secondary | ICD-10-CM | POA: Diagnosis not present

## 2020-09-03 DIAGNOSIS — Z0000 Encounter for general adult medical examination without abnormal findings: Secondary | ICD-10-CM | POA: Diagnosis not present

## 2020-09-03 DIAGNOSIS — E663 Overweight: Secondary | ICD-10-CM | POA: Diagnosis not present

## 2020-09-03 DIAGNOSIS — Z1331 Encounter for screening for depression: Secondary | ICD-10-CM | POA: Diagnosis not present

## 2020-09-07 IMAGING — US US ABDOMEN LIMITED
1 series · 13 of 25 positions shown · non-contrast
Comparison: CT of the abdomen and pelvis on 01/18/2012, ultrasound
of the abdomen on 01/19/2012

CLINICAL DATA: Liver cyst.

EXAM:
ULTRASOUND ABDOMEN LIMITED RIGHT UPPER QUADRANT

[Series 1: us abdomen limited · 0.28mm/px · 13 of 50 slices shown]
[im 1/50]
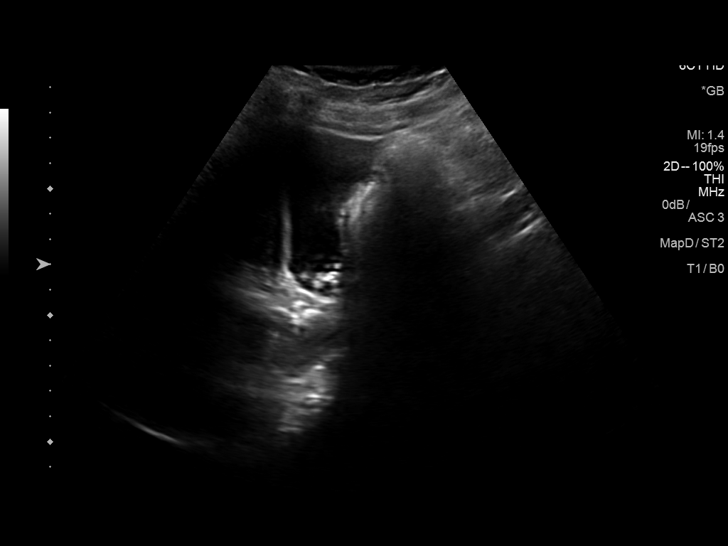
[im 5/50]
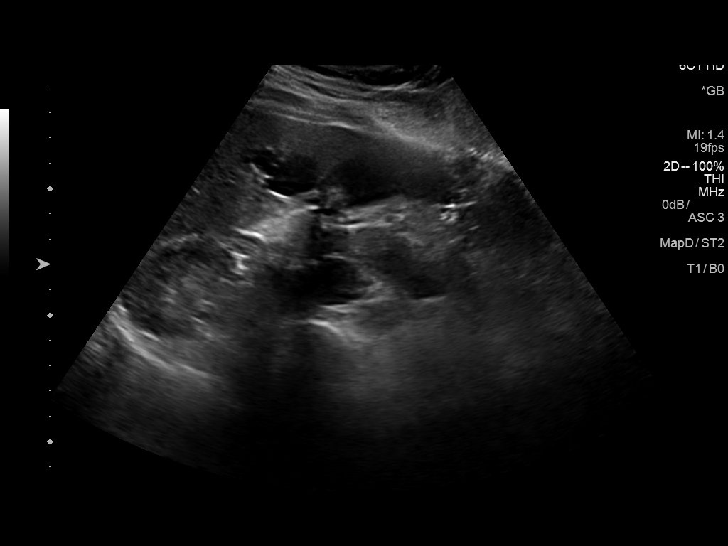
[im 9/50]
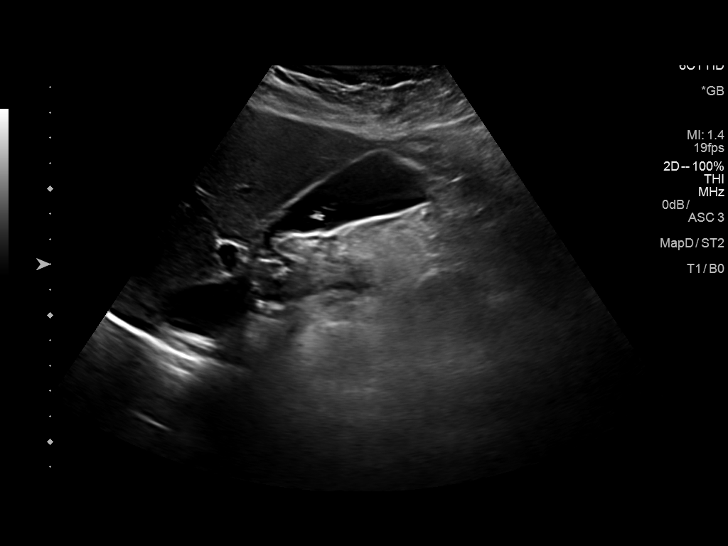
[im 13/50]
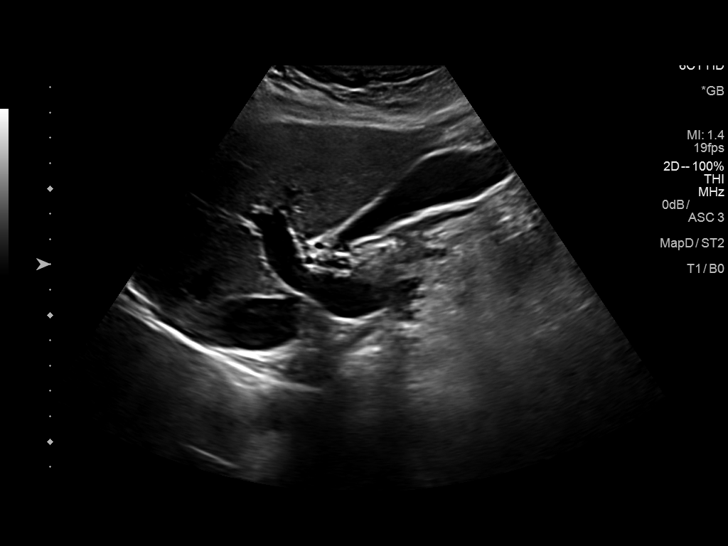
[im 17/50]
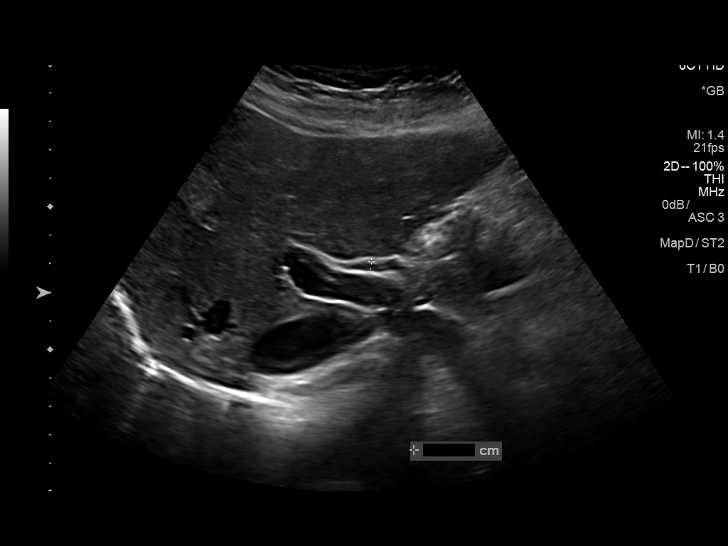
[im 21/50]
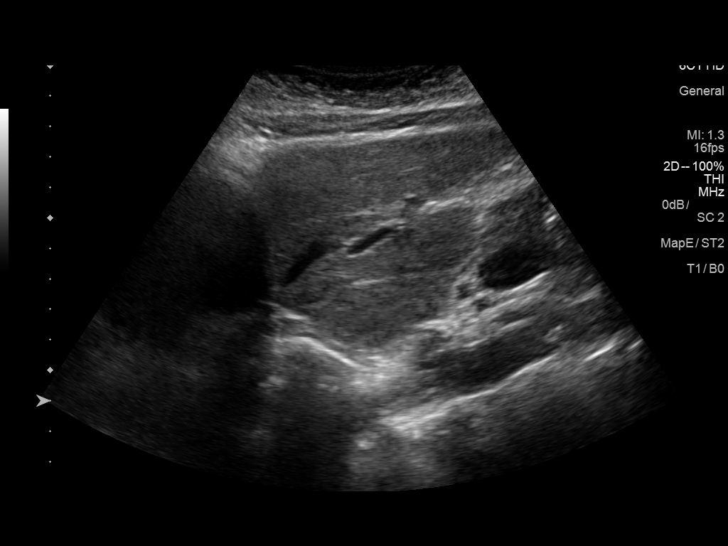
[im 25/50]
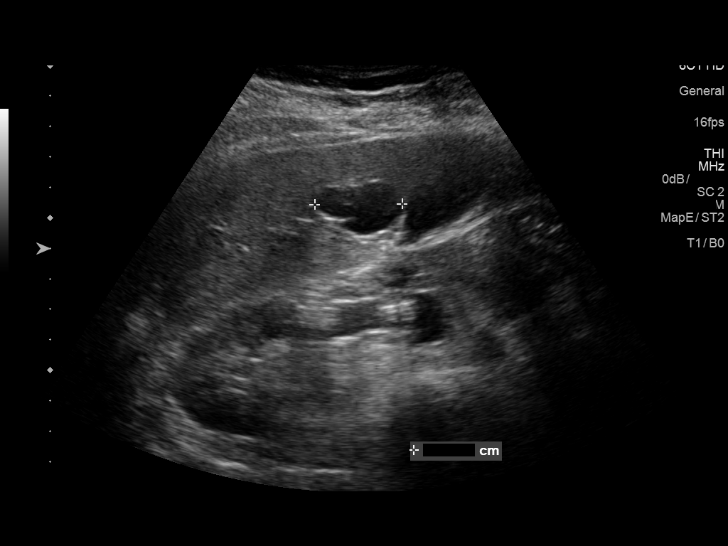
[im 29/50]
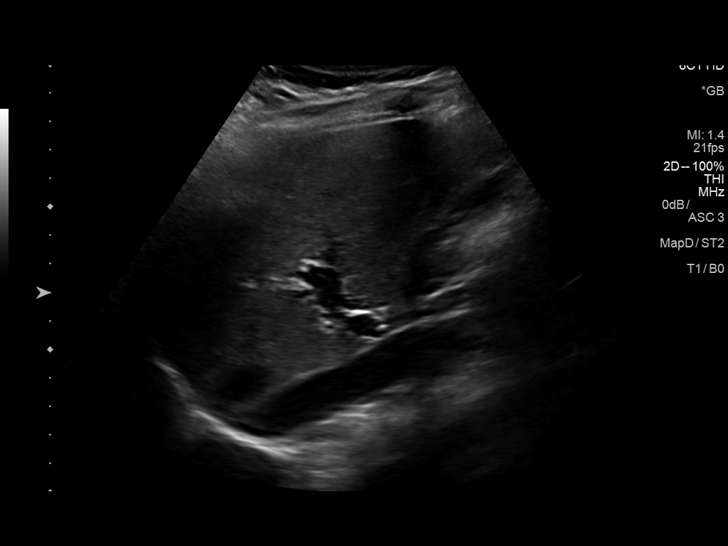
[im 33/50]
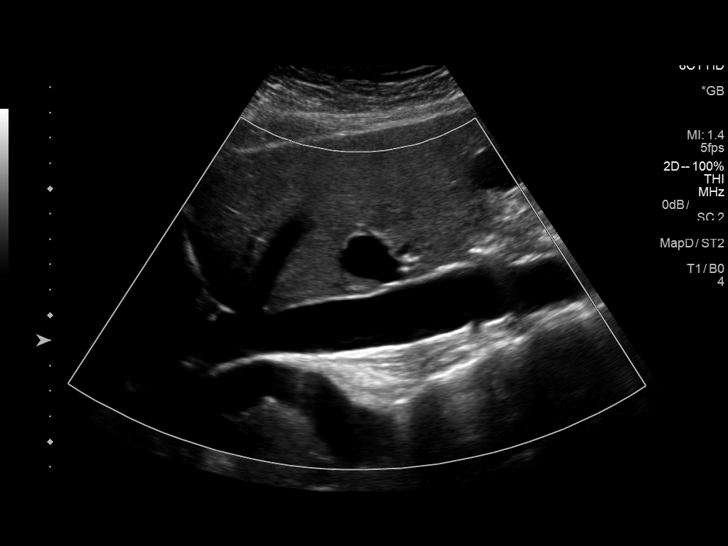
[im 37/50]
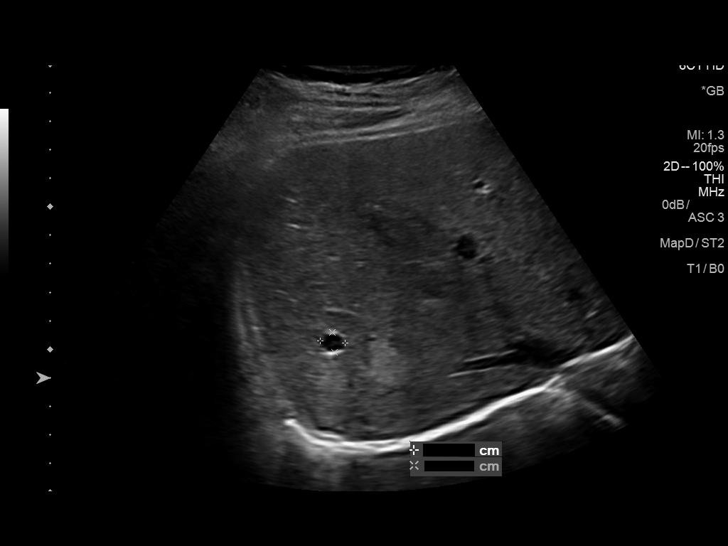
[im 41/50]
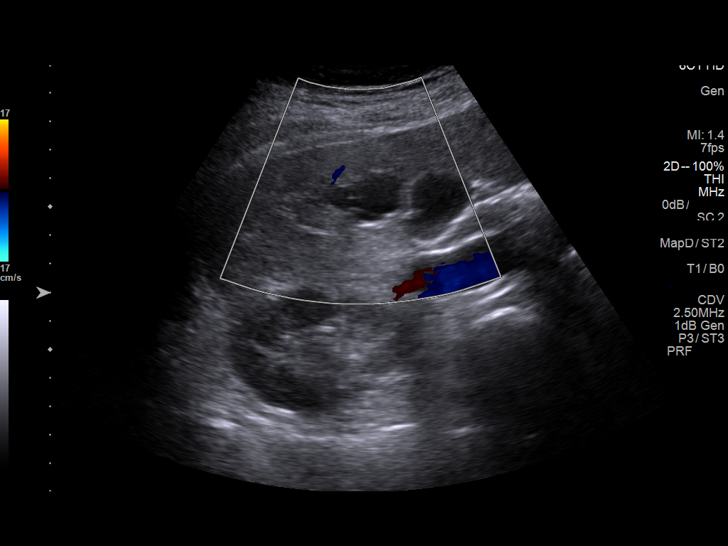
[im 45/50]
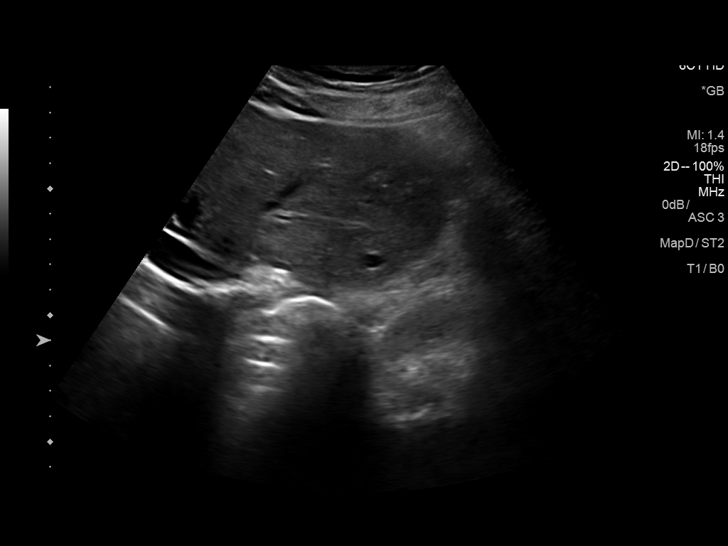
[im 50/50]
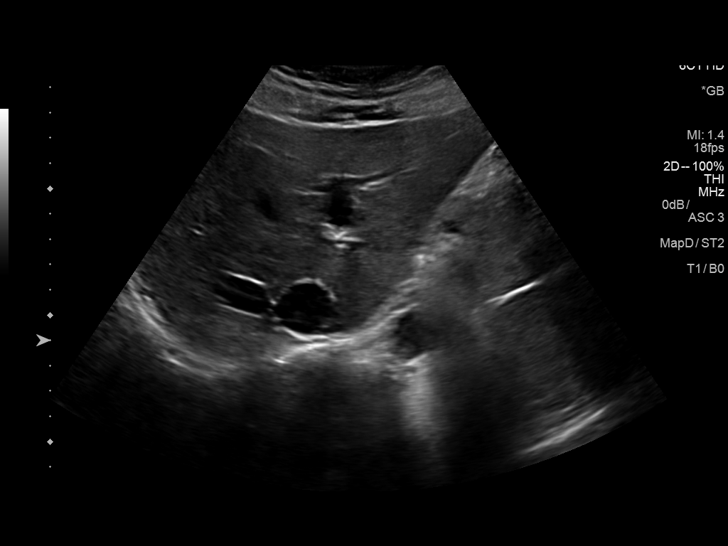

[13 of 25 positions shown; findings below may reference images not displayed]

FINDINGS: Gallbladder:

Gallbladder wall is 3 millimeters in thickness. A gallstone in the
region of the gallbladder neck is 8 millimeters in diameter. No
pericholecystic fluid or sonographic Murphy's sign.

Common bile duct:

Diameter: 3.5 millimeters

Liver:

Normal hepatic echogenicity.  Multiple liver lesions are present

A cyst with internal septations is identified adjacent to the
gallbladder fossa, measuring 2.4 x 2.1 x 2.9 centimeters.

At the dome of the RIGHT hepatic lobe, a cystic lesion is 1.1 x
x 1.0 centimeters.

A hyperechoic circumscribed mass in the superior aspect of the RIGHT
hepatic lobe is 1.9 x 1.9 x 2.0 centimeters.

Circumscribed hyperechoic lesion in the inferior RIGHT hepatic lobe
is 1.9 x 1.9 x 2.3 centimeters.

Small cyst in the inferior aspect of the RIGHT lobe is 0.7 x 0.9 x
1.0 centimeters.

LEFT hepatic cyst is 4.0 x 0.8 x 0.8 centimeters.

Portal vein is patent on color Doppler imaging with normal direction
of blood flow towards the liver.
IMPRESSION: 1. Cholelithiasis.  No other evidence for acute cholecystitis.
2. Stable, benign appearance of multiple hepatic cysts and
hemangiomas. No suspicious liver lesion.

## 2020-09-12 MED FILL — clonazePAM 0.5 MG TABS: 0.5 | 30 days supply | Qty: 60 | Fill #1

## 2020-09-21 ENCOUNTER — Encounter: Payer: 59 | Admitting: Gastroenterology

## 2020-09-25 ENCOUNTER — Other Ambulatory Visit (HOSPITAL_COMMUNITY): Payer: Self-pay | Admitting: Internal Medicine

## 2020-09-25 DIAGNOSIS — H6123 Impacted cerumen, bilateral: Secondary | ICD-10-CM | POA: Diagnosis not present

## 2020-09-25 DIAGNOSIS — H61893 Other specified disorders of external ear, bilateral: Secondary | ICD-10-CM | POA: Diagnosis not present

## 2020-09-25 DIAGNOSIS — H6592 Unspecified nonsuppurative otitis media, left ear: Secondary | ICD-10-CM | POA: Diagnosis not present

## 2020-09-25 MED FILL — DESVENLAFAXINE SUC ER 50 MG: 50 | 90 days supply | Qty: 90 | Fill #2

## 2020-09-25 MED FILL — TOPIRAMATE 50 MG TABLET: 50 | 90 days supply | Qty: 90 | Fill #0

## 2020-10-01 MED FILL — rOPINIRole HCL 0.5 MG TABS: 0.5 | 30 days supply | Qty: 30 | Fill #1

## 2020-10-04 ENCOUNTER — Other Ambulatory Visit (HOSPITAL_COMMUNITY): Payer: Self-pay | Admitting: Pharmacotherapy

## 2020-10-04 DIAGNOSIS — G43909 Migraine, unspecified, not intractable, without status migrainosus: Secondary | ICD-10-CM | POA: Diagnosis not present

## 2020-10-04 MED FILL — AIMOVIG 140 MG/ML SOAJ: 140 | 30 days supply | Qty: 1 | Fill #0

## 2020-10-10 MED FILL — NURTEC 75 MG TBDP: 75 | 30 days supply | Qty: 8 | Fill #1

## 2020-10-10 MED FILL — ESTRADIOL 0.075 MG/24HR PTT: 0.075 | 28 days supply | Qty: 8 | Fill #7

## 2020-10-10 MED FILL — clonazePAM 0.5 MG TABS: 0.5 | 30 days supply | Qty: 60 | Fill #2

## 2020-10-30 MED FILL — rOPINIRole HCL 0.5 MG TABS: 0.5 | 30 days supply | Qty: 30 | Fill #2

## 2020-11-09 ENCOUNTER — Encounter: Payer: 59 | Admitting: Gastroenterology

## 2020-11-09 MED FILL — CLINDAMYCIN PHOS-BENZOYL PE: 1-5 | 30 days supply | Qty: 25 | Fill #1

## 2020-11-09 MED FILL — NURTEC 75 MG TBDP: 75 | 30 days supply | Qty: 8 | Fill #2

## 2020-11-14 ENCOUNTER — Other Ambulatory Visit (HOSPITAL_COMMUNITY): Payer: Self-pay | Admitting: Internal Medicine

## 2020-11-14 MED FILL — clonazePAM 0.5 MG TABS: 0.5 | 30 days supply | Qty: 60 | Fill #0

## 2020-11-15 ENCOUNTER — Other Ambulatory Visit (HOSPITAL_BASED_OUTPATIENT_CLINIC_OR_DEPARTMENT_OTHER): Payer: Self-pay | Admitting: Emergency Medicine

## 2020-11-15 ENCOUNTER — Encounter (HOSPITAL_BASED_OUTPATIENT_CLINIC_OR_DEPARTMENT_OTHER): Payer: Self-pay | Admitting: *Deleted

## 2020-11-15 ENCOUNTER — Other Ambulatory Visit: Payer: Self-pay

## 2020-11-15 ENCOUNTER — Emergency Department (HOSPITAL_BASED_OUTPATIENT_CLINIC_OR_DEPARTMENT_OTHER): Payer: 59

## 2020-11-15 ENCOUNTER — Emergency Department (HOSPITAL_BASED_OUTPATIENT_CLINIC_OR_DEPARTMENT_OTHER)
Admission: EM | Admit: 2020-11-15 | Discharge: 2020-11-16 | Disposition: A | Discharge status: Home / Self Care | Payer: 59 | Attending: Emergency Medicine | Admitting: Emergency Medicine

## 2020-11-15 DIAGNOSIS — I10 Essential (primary) hypertension: Secondary | ICD-10-CM | POA: Insufficient documentation

## 2020-11-15 DIAGNOSIS — R109 Unspecified abdominal pain: Secondary | ICD-10-CM | POA: Diagnosis not present

## 2020-11-15 DIAGNOSIS — R112 Nausea with vomiting, unspecified: Secondary | ICD-10-CM | POA: Diagnosis not present

## 2020-11-15 DIAGNOSIS — R630 Anorexia: Secondary | ICD-10-CM | POA: Diagnosis not present

## 2020-11-15 DIAGNOSIS — I880 Nonspecific mesenteric lymphadenitis: Secondary | ICD-10-CM | POA: Insufficient documentation

## 2020-11-15 DIAGNOSIS — R197 Diarrhea, unspecified: Secondary | ICD-10-CM | POA: Diagnosis not present

## 2020-11-15 DIAGNOSIS — Z20822 Contact with and (suspected) exposure to covid-19: Secondary | ICD-10-CM | POA: Diagnosis not present

## 2020-11-15 HISTORY — DX: Calculus of gallbladder without cholecystitis without obstruction: K80.20

## 2020-11-15 LAB — CBC WITH DIFFERENTIAL/PLATELET
Abs Immature Granulocytes: 0.06 10*3/uL (ref 0.00–0.07)
Basophils Absolute: 0 10*3/uL (ref 0.0–0.1)
Basophils Relative: 0 %
Eosinophils Absolute: 0.1 10*3/uL (ref 0.0–0.5)
Eosinophils Relative: 0 %
HCT: 49.6 % — ABNORMAL HIGH (ref 36.0–46.0)
Hemoglobin: 16.1 g/dL — ABNORMAL HIGH (ref 12.0–15.0)
Immature Granulocytes: 0 %
Lymphocytes Relative: 11 %
Lymphs Abs: 1.6 10*3/uL (ref 0.7–4.0)
MCH: 28.3 pg (ref 26.0–34.0)
MCHC: 32.5 g/dL (ref 30.0–36.0)
MCV: 87.3 fL (ref 80.0–100.0)
Monocytes Absolute: 0.4 10*3/uL (ref 0.1–1.0)
Monocytes Relative: 3 %
Neutro Abs: 12.5 10*3/uL — ABNORMAL HIGH (ref 1.7–7.7)
Neutrophils Relative %: 86 %
Platelets: 436 10*3/uL — ABNORMAL HIGH (ref 150–400)
RBC: 5.68 MIL/uL — ABNORMAL HIGH (ref 3.87–5.11)
RDW: 13.1 % (ref 11.5–15.5)
WBC: 14.7 10*3/uL — ABNORMAL HIGH (ref 4.0–10.5)
nRBC: 0 % (ref 0.0–0.2)

## 2020-11-15 LAB — COMPREHENSIVE METABOLIC PANEL
ALT: 16 U/L (ref 0–44)
AST: 17 U/L (ref 15–41)
Albumin: 4.6 g/dL (ref 3.5–5.0)
Alkaline Phosphatase: 78 U/L (ref 38–126)
Anion gap: 14 (ref 5–15)
BUN: 15 mg/dL (ref 6–20)
CO2: 21 mmol/L — ABNORMAL LOW (ref 22–32)
Calcium: 9.7 mg/dL (ref 8.9–10.3)
Chloride: 105 mmol/L (ref 98–111)
Creatinine, Ser: 0.78 mg/dL (ref 0.44–1.00)
GFR, Estimated: >60 mL/min (ref >60)
Glucose, Bld: 114 mg/dL — ABNORMAL HIGH (ref 70–99)
Potassium: 3.8 mmol/L (ref 3.5–5.1)
Sodium: 140 mmol/L (ref 135–145)
Total Bilirubin: 0.5 mg/dL (ref 0.3–1.2)
Total Protein: 8.4 g/dL — ABNORMAL HIGH (ref 6.5–8.1)

## 2020-11-15 LAB — URINALYSIS, ROUTINE W REFLEX MICROSCOPIC
Bilirubin Urine: NEGATIVE
Glucose, UA: NEGATIVE mg/dL
Hgb urine dipstick: NEGATIVE
Ketones, ur: 80 mg/dL — AB
Leukocytes,Ua: NEGATIVE
Nitrite: NEGATIVE
Protein, ur: NEGATIVE mg/dL
Specific Gravity, Urine: 1.03 (ref 1.005–1.030)
pH: 6 (ref 5.0–8.0)

## 2020-11-15 LAB — LIPASE, BLOOD: Lipase: 27 U/L (ref 11–51)

## 2020-11-15 LAB — SARS CORONAVIRUS 2 BY RT PCR (HOSPITAL ORDER, PERFORMED IN ~~LOC~~ HOSPITAL LAB): SARS Coronavirus 2: NEGATIVE

## 2020-11-15 MED ORDER — METRONIDAZOLE 500 MG PO TABS: 500.0000 mg | ORAL_TABLET | Freq: Three times a day (TID) | ORAL | 0 refills | Status: DC

## 2020-11-15 MED ORDER — DICYCLOMINE HCL 20 MG PO TABS: 20.0000 mg | ORAL_TABLET | Freq: Two times a day (BID) | ORAL | 0 refills | Status: DC

## 2020-11-15 MED ORDER — SODIUM CHLORIDE 0.9 % IV BOLUS
1000.0000 mL | Freq: Once | INTRAVENOUS | Status: AC
  Administered 2020-11-15: 1000 mL via INTRAVENOUS

## 2020-11-15 MED ORDER — DICYCLOMINE HCL 10 MG PO CAPS
20.0000 mg | ORAL_CAPSULE | Freq: Once | ORAL | Status: AC
  Administered 2020-11-15: 20 mg via ORAL
  Filled 2020-11-15: qty 2

## 2020-11-15 MED ORDER — METRONIDAZOLE 500 MG PO TABS
500.0000 mg | ORAL_TABLET | Freq: Once | ORAL | Status: AC
  Administered 2020-11-15: 500 mg via ORAL
  Filled 2020-11-15: qty 1

## 2020-11-15 MED ORDER — ONDANSETRON 4 MG PO TBDP: 4.0000 mg | ORAL_TABLET | Freq: Three times a day (TID) | ORAL | 0 refills | Status: DC | PRN

## 2020-11-15 MED ORDER — ONDANSETRON HCL 4 MG/2ML IJ SOLN
4.0000 mg | Freq: Once | INTRAMUSCULAR | Status: AC
  Administered 2020-11-15: 4 mg via INTRAVENOUS
  Filled 2020-11-15: qty 2

## 2020-11-15 MED ORDER — IOHEXOL 300 MG/ML  SOLN
100.0000 mL | Freq: Once | INTRAMUSCULAR | Status: AC | PRN
  Administered 2020-11-15: 100 mL via INTRAVENOUS

## 2020-11-15 MED ORDER — CIPROFLOXACIN HCL 500 MG PO TABS
500.0000 mg | ORAL_TABLET | Freq: Once | ORAL | Status: AC
  Administered 2020-11-15: 500 mg via ORAL
  Filled 2020-11-15: qty 1

## 2020-11-15 MED ORDER — CIPROFLOXACIN HCL 500 MG PO TABS: 500.0000 mg | ORAL_TABLET | Freq: Two times a day (BID) | ORAL | 0 refills | Status: DC

## 2020-11-15 NOTE — ED Triage Notes (Signed)
C/o diffuse abd pain with n/v/d x 4 days

## 2020-11-15 NOTE — ED Provider Notes (Signed)
[facility] EMERGENCY DEPARTMENT Provider Note  CSN: [id number] Arrival date & time: 11/15/20  1355    History Chief Complaint  Patient presents with  . Abdominal Pain    Megan Martin is a 60 y.o. female history of gallstones, hypertension, partial hysterectomy, UTIs, presenting to emergency department with nausea vomiting and diarrhea for 4 days.  She reports that began with a migraine 5 days ago, which she says is not uncommon for her, she suffers from migraines.  Subsequently she has developed poor appetite, nausea and vomiting, and has had daily diarrhea, 4-5 episodes that appear like mucus and water.    She reports has had 2 doses of the Covid vaccine.  She denies cough, congestion, fevers or chills.  She denies sore throat.  HPI     Past Medical History:  Diagnosis Date  . Anemia   . Complication of anesthesia    Pt states she gets post-op N&V,dehydration,decreased B/P past sedation.She denies allergy to soy and eggs  . Depression   . Gallstones   . Hyperlipidemia   . Insomnia   . Migraine   . S/P partial hysterectomy   . UTI (lower urinary tract infection)     Patient Active Problem List   Diagnosis Date Noted  . Hypotension 01/21/2012    Class: Acute  . Sepsis(995.91) 01/18/2012  . Nausea and vomiting 01/18/2012  . Leukocytosis 01/18/2012  . Headache(784.0) 01/18/2012  . Iron deficiency anemia 01/12/2011  . Reactive depression (situational) 01/12/2011  . History of UTI 01/12/2011  . Insomnia 01/12/2011  . Restless leg syndrome 01/12/2011  . Hyperlipemia 01/12/2011    Past Surgical History:  Procedure Laterality Date  . ABDOMINAL HYSTERECTOMY    . BLEPHAROPLASTY     bil lower  . CESAREAN SECTION  1982, 1984  . COLONOSCOPY  05/01/2015  . LAPAROSCOPIC HYSTERECTOMY     still has cervix, ovaries, 1 tube  . MYOMECTOMY    . POLYPECTOMY    . sinus irrigations     two times /1968  . TONSILLECTOMY    . WISDOM TOOTH EXTRACTION       OB  History   No obstetric history on file.     Family History  Problem Relation Age of Onset  . Heart disease Mother   . Hypertension Mother   . Heart disease Father   . Early death Father   . Colon polyps Father   . Hypertension Brother   . Hyperlipidemia Brother   . Heart disease Brother   . Colon polyps Brother   . Colon cancer Paternal Uncle   . Esophageal cancer Neg Hx   . Rectal cancer Neg Hx   . Stomach cancer Neg Hx     Social History   Tobacco Use  . Smoking status: Never Smoker  . Smokeless tobacco: Never Used  Vaping Use  . Vaping Use: Never used  Substance Use Topics  . Alcohol use: No  . Drug use: No    Home Medications Prior to Admission medications   Medication Sig Start Date End Date Taking? Authorizing Provider  ciprofloxacin (CIPRO) 500 MG tablet Take 1 tablet (500 mg total) by mouth 2 (two) times daily for 7 days. 11/16/20 11/23/20 Yes Sibyl Mikula, Carola Rhine, MD  dicyclomine (BENTYL) 20 MG tablet Take 1 tablet (20 mg total) by mouth 2 (two) times daily for 10 days. 11/15/20 11/25/20 Yes Elvy Mclarty, Carola Rhine, MD  metroNIDAZOLE (FLAGYL) 500 MG tablet Take 1 tablet (500 mg total) by mouth  3 (three) times daily for 7 days. 11/16/20 11/23/20 Yes Wyvonnia Dusky, MD  ondansetron (ZOFRAN ODT) 4 MG disintegrating tablet Take 1 tablet (4 mg total) by mouth every 8 (eight) hours as needed for up to 15 days for nausea or vomiting. 11/15/20 11/30/20 Yes Staley Budzinski, Carola Rhine, MD  clindamycin-benzoyl peroxide [facility]) gel SMARTSIG:Sparingly Topical Twice Daily 05/08/20   [provider]  clonazePAM (KLONOPIN) 0.5 MG tablet Take 0.5-1 mg by mouth at bedtime.     [provider]  estradiol (VIVELLE-DOT) 0.1 MG/24HR Place 1 patch onto the skin 2 (two) times a week.      [provider]  NURTEC 75 MG TBDP  04/13/20   [provider]  ondansetron (ZOFRAN ODT) 4 MG disintegrating tablet 4mg  ODT q4 hours prn nausea/vomit 03/12/19   Deno Etienne, DO  PRISTIQ 50  MG 24 hr tablet TAKE 1 TABLET BY MOUTH DAILY Patient taking differently: TAKE 1 TABLET BY MOUTH AT BEDTIME 08/21/11   de Milnor, Ivy, DO  rOPINIRole (REQUIP) 0.5 MG tablet Take 0.5 mg by mouth at bedtime. 06/23/19   [provider]  topiramate (TOPAMAX) 50 MG tablet Take 50 mg by mouth at bedtime. 07/11/20   [provider]    Allergies    Demerol and Reglan [metoclopramide]  Review of Systems   Review of Systems  Constitutional: Positive for appetite change, chills, fatigue and fever.  HENT: Negative for ear pain and sore throat.   Eyes: Negative for pain and visual disturbance.  Respiratory: Negative for cough and shortness of breath.   Cardiovascular: Negative for chest pain and palpitations.  Gastrointestinal: Positive for abdominal pain, diarrhea, nausea and vomiting.  Genitourinary: Negative for dysuria and hematuria.  Musculoskeletal: Negative for arthralgias and back pain.  Skin: Negative for color change and rash.  Neurological: Positive for light-headedness. Negative for syncope.  All other systems reviewed and are negative.   Physical Exam Updated Vital Signs BP 119/72 (BP Location: Left Arm)   Pulse (!) 103   Temp 98.4 F (36.9 C) (Oral)   Resp 18   Ht 5\' 2"  (1.575 m)   Wt 73.5 kg   SpO2 100%   BMI 29.63 kg/m   Physical Exam Constitutional:      General: She is not in acute distress. HENT:     Head: Normocephalic and atraumatic.  Eyes:     Conjunctiva/sclera: Conjunctivae normal.     Pupils: Pupils are equal, round, and reactive to light.  Cardiovascular:     Rate and Rhythm: Normal rate and regular rhythm.  Pulmonary:     Effort: Pulmonary effort is normal. No respiratory distress.  Abdominal:     General: There is no distension.     Tenderness: There is generalized abdominal tenderness. There is no guarding or rebound. Negative signs include Murphy's sign and McBurney's sign.  Skin:    General: Skin is warm and dry.  Neurological:      General: No focal deficit present.     Mental Status: She is alert. Mental status is at baseline.  Psychiatric:        Mood and Affect: Mood normal.        Behavior: Behavior normal.     ED Results / Procedures / Treatments   Labs (all labs ordered are listed, but only abnormal results are displayed) Labs Reviewed  URINALYSIS, ROUTINE W REFLEX MICROSCOPIC - Abnormal; Notable for the following components:      Result Value   Ketones,  ur 80 (*)    All other components within normal limits  CBC WITH DIFFERENTIAL/PLATELET - Abnormal; Notable for the following components:   WBC 14.7 (*)    RBC 5.68 (*)    Hemoglobin 16.1 (*)    HCT 49.6 (*)    Platelets 436 (*)    Neutro Abs 12.5 (*)    All other components within normal limits  COMPREHENSIVE METABOLIC PANEL - Abnormal; Notable for the following components:   CO2 21 (*)    Glucose, Bld 114 (*)    Total Protein 8.4 (*)    All other components within normal limits  SARS CORONAVIRUS 2 BY RT PCR (HOSPITAL ORDER, [place] [facility])  LIPASE, BLOOD    EKG None  Radiology CT ABDOMEN PELVIS W CONTRAST  Result Date: 11/15/2020 CLINICAL DATA:  60 year old female with abdominal pain. Concern for acute diverticulitis. EXAM: CT ABDOMEN AND PELVIS WITH CONTRAST TECHNIQUE: Multidetector CT imaging of the abdomen and pelvis was performed using the standard protocol following bolus administration of intravenous contrast. CONTRAST:  142mL OMNIPAQUE IOHEXOL 300 MG/ML  SOLN COMPARISON:  CT abdomen pelvis dated 01/18/2012. FINDINGS: Lower chest: The visualized lung bases are clear. No intra-abdominal free air. Small free fluid in the pelvis. Hepatobiliary: Probable mild fatty liver. Multiple liver cysts measure up to 2.7 cm. Several subcentimeter hypodense lesions are too small to characterize. There is no intrahepatic biliary dilatation. The gallbladder is unremarkable. Pancreas: Unremarkable. No pancreatic ductal dilatation or  surrounding inflammatory changes. Spleen: Normal in size without focal abnormality. Adrenals/Urinary Tract: The adrenal glands unremarkable. There is no hydronephrosis on either side. There is symmetric enhancement and excretion of contrast by both kidneys. The visualized ureters appear unremarkable. The urinary bladder is collapsed. Stomach/Bowel: There is no bowel obstruction or active inflammation. The appendix is not visualized with certainty. No inflammatory changes identified in the right lower quadrant. Vascular/Lymphatic: The abdominal aorta and IVC unremarkable. No portal venous gas. There is mild diffuse mesenteric edema. Multiple mildly enlarged and top-normal mesenteric lymph nodes, nonspecific, likely reactive. No retroperitoneal adenopathy. Reproductive: Hysterectomy. No adnexal masses. Other: None Musculoskeletal: No acute or significant osseous findings. IMPRESSION: 1. Multiple mildly enlarged and top-normal mesenteric lymph nodes, nonspecific, likely reactive. Findings may represent mesenteric adenitis or panniculitis. Clinical correlation is recommended. 2. No bowel obstruction. Electronically Signed   By: Anner Crete M.D.   On: 11/15/2020 22:17    Procedures Procedures   Medications Ordered in ED Medications  ondansetron (ZOFRAN) injection 4 mg (4 mg Intravenous Given 11/15/20 2145)  sodium chloride 0.9 % bolus 1,000 mL (0 mLs Intravenous Stopped 11/16/20 0004)  dicyclomine (BENTYL) capsule 20 mg (20 mg Oral Given 11/15/20 2031)  iohexol (OMNIPAQUE) 300 MG/ML solution 100 mL (100 mLs Intravenous Contrast Given 11/15/20 2153)  ciprofloxacin (CIPRO) tablet 500 mg (500 mg Oral Given 11/15/20 2334)  metroNIDAZOLE (FLAGYL) tablet 500 mg (500 mg Oral Given 11/15/20 2334)    ED Course  I have reviewed the triage vital signs and the nursing notes.  Pertinent labs & imaging results that were available during my care of the patient were reviewed by me and considered in my medical decision  making (see chart for details).  This patient presents to the Emergency Department with complaint of abdominal pain. This involves an extensive number of treatment options, and is a complaint that carries with it a high risk of complications and morbidity.  The differential diagnosis includes gastritis vs inflammatory bowel disease vs intraabdominal infection vs UTI  vs other  I ordered, reviewed, and interpreted labs, including WBC 14.7, hgb 16.1 (likely some hemoconcentration), CMP largely unremarkable - unlikely hepatitis or acute biliary disease. Covid negative, UA with ketones, no leuks or nitrites - doubt UTI.  Lipse 27 - unlikely pancreatitis I ordered medication Cipro, flagyl, IV zofran, IV fluids, and PO benyl for abdominal pain, nausea, and possible intraabdominal infeciton I ordered imaging studies which included CT abdomen with IV contrast I independently visualized and interpreted imaging which showed mesenteric adenitis and the monitor tracing which showed sinus tachycardia  Based on the patient's clinical exam, vital signs, risk factors, and ED testing, I felt that the patient's overall risk of life-threatening emergency such as bowel perforation, surgical emergency, or sepsis was quite low.  I suspect this clinical presentation is most consistent with mesenteric adenitis, but explained to the patient that this evaluation was not a definitive diagnostic workup.  I discussed return precautions with the patient. I felt the patient was clinically stable for discharge.   Clinical Course as of 11/16/20 0139  Thu Nov 15, 2020  2326 Tolerated sips of water.  Suspect GI upset from mesenteric adenitis, discussed empiric treatment with antibiotics vs NSAIDS alone, and agreed with her leukocytosis that a course of antibiotic for enteritis organisms would be a safer plan.  1 week of cipro and flagyl [MT]    Clinical Course User Index [MT] Wyvonnia Dusky, MD    Final Clinical Impression(s)  / ED Diagnoses Final diagnoses:  Mesenteric adenitis  Non-intractable vomiting with nausea, unspecified vomiting type    Rx / DC Orders ED Discharge Orders         Ordered    metroNIDAZOLE (FLAGYL) 500 MG tablet  3 times daily        11/15/20 2330    ciprofloxacin (CIPRO) 500 MG tablet  2 times daily        11/15/20 2330    ondansetron (ZOFRAN ODT) 4 MG disintegrating tablet  Every 8 hours PRN        11/15/20 2330    dicyclomine (BENTYL) 20 MG tablet  2 times daily        11/15/20 2330           Wyvonnia Dusky, MD 11/16/20 0139

## 2020-11-15 NOTE — Discharge Instructions (Addendum)
You should take these two antibiotics Ciprofloxacin and Flagyl as prescribed for the next 7 days.   Still to a clear liquid diet for the next 48 hours.  You can use zofran for nausea at home, and bentyl for abdominal pain if it helped.    Follow up with your PCP in 2-3 days for a symptom recheck.

## 2020-11-15 NOTE — ED Notes (Signed)
Attempted ultrasound IV X 1 without success. Patient tolerated well. °

## 2020-11-15 NOTE — ED Notes (Signed)
Unsuccessful obtaining IV access.  Staff attempting IV via ultrasound

## 2020-11-16 ENCOUNTER — Institutional Professional Consult (permissible substitution): Payer: 59 | Admitting: Physician Assistant

## 2020-11-16 MED FILL — CIPROFLOXACIN HCL 500 MG TA: 500 | 7 days supply | Qty: 14 | Fill #0

## 2020-11-16 MED FILL — DICYCLOMINE 20 MG TABLET: 20 | 10 days supply | Qty: 20 | Fill #0

## 2020-11-16 MED FILL — ONDANSETRON ODT 4 MG TABLET: 4 | 5 days supply | Qty: 15 | Fill #0

## 2020-11-16 MED FILL — metroNIDAZOLE 500 MG TABS: 500 | 7 days supply | Qty: 21 | Fill #0

## 2020-11-16 NOTE — ED Notes (Signed)
Passed PO challenge

## 2020-11-19 ENCOUNTER — Telehealth: Payer: Self-pay | Admitting: Gastroenterology

## 2020-11-19 NOTE — Telephone Encounter (Signed)
Good afternoon Dr. Fuller Plan, patient called stating she is currently at the ED at [facility] so she requested her procedure scheduled for 11/20/2020 be canceled.

## 2020-11-19 NOTE — Telephone Encounter (Signed)
I see an [facility] ED evaluation on 1/27.

## 2020-11-19 NOTE — Telephone Encounter (Signed)
I only see 1/27.  Perhaps she is in the waiting room at ED.

## 2020-11-20 ENCOUNTER — Encounter: Payer: 59 | Admitting: Gastroenterology

## 2020-11-29 MED FILL — rOPINIRole HCL 0.5 MG TABS: 0.5 | 30 days supply | Qty: 30 | Fill #3

## 2020-12-03 ENCOUNTER — Other Ambulatory Visit: Payer: Self-pay

## 2020-12-03 ENCOUNTER — Ambulatory Visit: Payer: 59 | Admitting: Physician Assistant

## 2020-12-03 ENCOUNTER — Encounter: Payer: Self-pay | Admitting: *Deleted

## 2020-12-03 ENCOUNTER — Encounter: Payer: Self-pay | Admitting: Physician Assistant

## 2020-12-03 ENCOUNTER — Other Ambulatory Visit: Payer: Self-pay | Admitting: Physician Assistant

## 2020-12-03 VITALS — BP 119/78 | HR 79

## 2020-12-03 DIAGNOSIS — G43001 Migraine without aura, not intractable, with status migrainosus: Secondary | ICD-10-CM | POA: Diagnosis not present

## 2020-12-03 MED ORDER — CYCLOBENZAPRINE HCL 10 MG PO TABS: 10.0000 mg | ORAL_TABLET | Freq: Three times a day (TID) | ORAL | 2 refills | Status: DC | PRN

## 2020-12-03 MED ORDER — VENLAFAXINE HCL ER 37.5 MG PO CP24: 37.5000 mg | ORAL_CAPSULE | Freq: Every day | ORAL | 3 refills | Status: DC

## 2020-12-03 MED ORDER — AIMOVIG 140 MG/ML ~~LOC~~ SOAJ: 140.0000 mg | SUBCUTANEOUS | 11 refills | Status: DC

## 2020-12-03 MED ORDER — SUMATRIPTAN SUCCINATE 100 MG PO TABS: 100.0000 mg | ORAL_TABLET | Freq: Once | ORAL | 11 refills | Status: DC | PRN

## 2020-12-03 MED FILL — VENLAFAXINE HCL ER 37.5 MG: 37.5 | 60 days supply | Qty: 60 | Fill #0

## 2020-12-03 MED FILL — CYCLOBENZAPRINE HCL 10 MG T: 10 | 10 days supply | Qty: 30 | Fill #0

## 2020-12-03 MED FILL — AIMOVIG 140 MG/ML SOAJ: 140 | 30 days supply | Qty: 1 | Fill #0

## 2020-12-03 MED FILL — SUMAtriptan SUCCINATE 100 M: 100 | 30 days supply | Qty: 9 | Fill #0

## 2020-12-03 NOTE — Progress Notes (Signed)
History:  Megan Martin is a 60 y.o. Has been getting headaches for years.  She was having to have them treated in the hospital and recognized that as a problem.  She has it all the time and doesn't think much about it until its interferring more with her life.   They get to be severe, located behind eyes and moves up.  There is throbbing.  Sensitivity to light, noise, smells.  There is nausea and vomiting.  It can go on for more than a day and at this point she's not sure she ever has a time without a headache.    Sleep is an issue and she has dealt with insomnia throughout her lifetime. She uses clonazepam and pristiq for sleep.  She could not safely use hypnotics like ambien.  Her PCP started her on topamax.  Her BP was 80/54 and she attributes that to topamax.  She also felt it was interferring with her mental abilities.  She has discontinued this medication.  She was formerly on Effexor and switched in favor of Pristiq. Nurtec - not helpful Imitrex - was helpful She has been on aimovig x 2 injections = nearly due for third  Number of days in the last 4 weeks with:  Severe 7 Moderate 7 Mild 14 No headache 0  Past Medical History:  Diagnosis Date  . Anemia   . Complication of anesthesia    Pt states she gets post-op N&V,dehydration,decreased B/P past sedation.She denies allergy to soy and eggs  . Depression   . Gallstones   . Hyperlipidemia   . Insomnia   . Migraine   . S/P partial hysterectomy   . UTI (lower urinary tract infection)     Social History   Socioeconomic History  . Marital status: Married    Spouse name: Not on file  . Number of children: Not on file  . Years of education: Not on file  . Highest education level: Not on file  Occupational History  . Not on file  Tobacco Use  . Smoking status: Never Smoker  . Smokeless tobacco: Never Used  Vaping Use  . Vaping Use: Never used  Substance and Sexual Activity  . Alcohol use: No  . Drug use: No  .  Sexual activity: Not Currently    Birth control/protection: Surgical  Other Topics Concern  . 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 Is active, ambulatory without cane or walker. Divorced, has two children.    Social Determinants of Health   Financial Resource Strain: Not on file  Food Insecurity: Not on file  Transportation Needs: Not on file  Physical Activity: Not on file  Stress: Not on file  Social Connections: Not on file  Intimate Partner Violence: Not on file    Family History  Problem Relation Age of Onset  . Heart disease Mother   . Hypertension Mother   . Heart disease Father   . Early death Father   . Colon polyps Father   . Hypertension Brother   . Hyperlipidemia Brother   . Heart disease Brother   . Colon polyps Brother   . Colon cancer Paternal Uncle   . Esophageal cancer Neg Hx   . Rectal cancer Neg Hx   . Stomach cancer Neg Hx     Allergies  Allergen Reactions  . Demerol Other (See Comments)    Swelling of arm  . Reglan [  Metoclopramide]     Agitation and anxiousness    Current Outpatient Medications on File Prior to Visit  Medication Sig Dispense Refill  . clindamycin-benzoyl peroxide (BENZACLIN) gel SMARTSIG:Sparingly Topical Twice Daily    . clonazePAM (KLONOPIN) 0.5 MG tablet Take 0.5-1 mg by mouth at bedtime.     Marland Kitchen estradiol (VIVELLE-DOT) 0.1 MG/24HR Place 1 patch onto the skin 2 (two) times a week.    . NURTEC 75 MG TBDP     . PRISTIQ 50 MG 24 hr tablet TAKE 1 TABLET BY MOUTH DAILY 30 tablet 3  . rOPINIRole (REQUIP) 0.5 MG tablet Take 0.5 mg by mouth at bedtime.    . dicyclomine (BENTYL) 20 MG tablet Take 1 tablet (20 mg total) by mouth 2 (two) times daily for 10 days. 20 tablet 0  . ondansetron (ZOFRAN ODT) 4 MG disintegrating tablet 4mg  ODT q4 hours prn nausea/vomit (Patient not taking: Reported on 12/03/2020) 20 tablet 0  . topiramate (TOPAMAX) 50 MG tablet Take 50 mg  by mouth at bedtime. (Patient not taking: Reported on 12/03/2020)     No current facility-administered medications on file prior to visit.     Review of Systems:  All pertinent positive/negative included in HPI, all other review of systems are negative   Objective:  Physical Exam BP 119/78   Pulse 79  CONSTITUTIONAL: Well-developed, well-nourished female in no acute distress.  EYES: EOM intact ENT: Normocephalic CARDIOVASCULAR: Regular rate  RESPIRATORY: Normal rate.  MUSCULOSKELETAL: Normal ROM SKIN: Warm, dry without erythema  NEUROLOGICAL: Alert, oriented, CN II-XII grossly intact, Appropriate balance PSYCH: Normal behavior, mood   Assessment & Plan:  Assessment: Status migranosus  Plan: Taper and d/c Pristiq in favor of Effexor.  Begin effexor with 37.5mg  and titrate to 75mg  after one week.  Effexor for migraine prevention. Aimovig for prevention - increase to 140mg  dosing.  Sample provided. Sumatriptan for acute migraine Flexeril for other headache or migraine when not able to use sumatriptan due to frequency Follow-up in 3 months or sooner PRN  Paticia Stack, PA-C 12/03/2020 1:03 PM

## 2020-12-03 NOTE — Patient Instructions (Signed)

## 2020-12-15 MED FILL — clonazePAM 0.5 MG TABS: 0.5 | 30 days supply | Qty: 60 | Fill #1

## 2020-12-17 ENCOUNTER — Other Ambulatory Visit (HOSPITAL_COMMUNITY): Payer: Self-pay | Admitting: Obstetrics and Gynecology

## 2020-12-17 DIAGNOSIS — Z1231 Encounter for screening mammogram for malignant neoplasm of breast: Secondary | ICD-10-CM | POA: Diagnosis not present

## 2020-12-17 DIAGNOSIS — N959 Unspecified menopausal and perimenopausal disorder: Secondary | ICD-10-CM | POA: Diagnosis not present

## 2020-12-17 DIAGNOSIS — Z6829 Body mass index (BMI) 29.0-29.9, adult: Secondary | ICD-10-CM | POA: Diagnosis not present

## 2020-12-17 DIAGNOSIS — Z01419 Encounter for gynecological examination (general) (routine) without abnormal findings: Secondary | ICD-10-CM | POA: Diagnosis not present

## 2020-12-17 MED FILL — ESTRADIOL 0.05 MG/24HR PTTW: 0.05 | 84 days supply | Qty: 24 | Fill #0

## 2020-12-20 ENCOUNTER — Other Ambulatory Visit: Payer: Self-pay | Admitting: Obstetrics and Gynecology

## 2020-12-20 DIAGNOSIS — R928 Other abnormal and inconclusive findings on diagnostic imaging of breast: Secondary | ICD-10-CM

## 2020-12-27 MED FILL — rOPINIRole HCL 0.5 MG TABS: 0.5 | 30 days supply | Qty: 30 | Fill #4

## 2021-01-03 ENCOUNTER — Other Ambulatory Visit: Payer: 59

## 2021-01-07 MED FILL — CYCLOBENZAPRINE HCL 10 MG T: 10 | 10 days supply | Qty: 30 | Fill #1

## 2021-01-11 ENCOUNTER — Other Ambulatory Visit (HOSPITAL_COMMUNITY): Payer: Self-pay | Admitting: Physician Assistant

## 2021-01-11 MED FILL — AIMOVIG 140 MG/ML SOAJ: 140 | 30 days supply | Qty: 1 | Fill #1

## 2021-01-11 MED FILL — VENLAFAXINE HCL ER 37.5 MG: 37.5 | 30 days supply | Qty: 60 | Fill #0

## 2021-01-14 MED FILL — clonazePAM 0.5 MG TABS: 0.5 | 30 days supply | Qty: 60 | Fill #2

## 2021-01-15 ENCOUNTER — Other Ambulatory Visit: Payer: Self-pay

## 2021-01-15 ENCOUNTER — Ambulatory Visit
Admission: RE | Admit: 2021-01-15 | Discharge: 2021-01-15 | Disposition: A | Discharge status: Home / Self Care | Payer: 59 | Source: Ambulatory Visit | Attending: Obstetrics and Gynecology | Admitting: Obstetrics and Gynecology

## 2021-01-15 DIAGNOSIS — R922 Inconclusive mammogram: Secondary | ICD-10-CM | POA: Diagnosis not present

## 2021-01-15 DIAGNOSIS — N6011 Diffuse cystic mastopathy of right breast: Secondary | ICD-10-CM | POA: Diagnosis not present

## 2021-01-15 DIAGNOSIS — R928 Other abnormal and inconclusive findings on diagnostic imaging of breast: Secondary | ICD-10-CM

## 2021-02-03 MED FILL — Ropinirole Hydrochloride Tab 0.5 MG: ORAL | 30 days supply | Qty: 30 | Fill #0 | Status: AC

## 2021-02-03 MED FILL — Erenumab-aooe Subcutaneous Soln Auto-Injector 140 MG/ML: SUBCUTANEOUS | 30 days supply | Qty: 1 | Fill #0 | Status: AC

## 2021-02-03 MED FILL — Sumatriptan Succinate Tab 100 MG: ORAL | 30 days supply | Qty: 9 | Fill #0 | Status: AC

## 2021-02-03 MED FILL — Cyclobenzaprine HCl Tab 10 MG: ORAL | 10 days supply | Qty: 30 | Fill #0 | Status: AC

## 2021-02-04 ENCOUNTER — Other Ambulatory Visit (HOSPITAL_COMMUNITY): Payer: Self-pay

## 2021-02-07 ENCOUNTER — Other Ambulatory Visit (HOSPITAL_COMMUNITY): Payer: Self-pay

## 2021-02-07 ENCOUNTER — Other Ambulatory Visit (HOSPITAL_COMMUNITY): Payer: Self-pay | Admitting: Internal Medicine

## 2021-02-07 MED FILL — Venlafaxine HCl Cap ER 24HR 37.5 MG (Base Equivalent): ORAL | 30 days supply | Qty: 60 | Fill #0 | Status: AC

## 2021-02-08 ENCOUNTER — Other Ambulatory Visit (HOSPITAL_COMMUNITY): Payer: Self-pay

## 2021-02-11 ENCOUNTER — Other Ambulatory Visit (HOSPITAL_COMMUNITY): Payer: Self-pay

## 2021-02-11 ENCOUNTER — Other Ambulatory Visit (HOSPITAL_COMMUNITY): Payer: Self-pay | Admitting: Internal Medicine

## 2021-02-12 ENCOUNTER — Other Ambulatory Visit (HOSPITAL_COMMUNITY): Payer: Self-pay

## 2021-02-13 ENCOUNTER — Other Ambulatory Visit (HOSPITAL_COMMUNITY): Payer: Self-pay

## 2021-02-15 ENCOUNTER — Other Ambulatory Visit (HOSPITAL_COMMUNITY): Payer: Self-pay

## 2021-02-15 ENCOUNTER — Other Ambulatory Visit (HOSPITAL_COMMUNITY): Payer: Self-pay | Admitting: Internal Medicine

## 2021-02-15 MED ORDER — CLONAZEPAM 0.5 MG PO TABS
1.0000 mg | ORAL_TABLET | Freq: Every day | ORAL | 2 refills | Status: DC
  Filled 2021-02-15: qty 60, 30d supply, fill #0
  Filled 2021-03-19: qty 60, 30d supply, fill #1
  Filled 2021-04-18: qty 60, 30d supply, fill #2

## 2021-02-18 ENCOUNTER — Other Ambulatory Visit (HOSPITAL_COMMUNITY): Payer: Self-pay

## 2021-02-20 ENCOUNTER — Other Ambulatory Visit (HOSPITAL_COMMUNITY): Payer: Self-pay

## 2021-03-05 ENCOUNTER — Other Ambulatory Visit: Payer: Self-pay | Admitting: Physician Assistant

## 2021-03-05 MED FILL — Ropinirole Hydrochloride Tab 0.5 MG: ORAL | 30 days supply | Qty: 30 | Fill #1 | Status: AC

## 2021-03-05 MED FILL — Erenumab-aooe Subcutaneous Soln Auto-Injector 140 MG/ML: SUBCUTANEOUS | 30 days supply | Qty: 1 | Fill #1 | Status: AC

## 2021-03-05 MED FILL — Sumatriptan Succinate Tab 100 MG: ORAL | 30 days supply | Qty: 9 | Fill #1 | Status: AC

## 2021-03-06 ENCOUNTER — Other Ambulatory Visit: Payer: Self-pay | Admitting: Physician Assistant

## 2021-03-06 ENCOUNTER — Other Ambulatory Visit (HOSPITAL_COMMUNITY): Payer: Self-pay

## 2021-03-08 ENCOUNTER — Other Ambulatory Visit (HOSPITAL_COMMUNITY): Payer: Self-pay

## 2021-03-08 ENCOUNTER — Other Ambulatory Visit: Payer: Self-pay | Admitting: Physician Assistant

## 2021-03-09 ENCOUNTER — Other Ambulatory Visit: Payer: Self-pay | Admitting: Physician Assistant

## 2021-03-09 ENCOUNTER — Other Ambulatory Visit (HOSPITAL_COMMUNITY): Payer: Self-pay

## 2021-03-11 ENCOUNTER — Other Ambulatory Visit (HOSPITAL_COMMUNITY): Payer: Self-pay | Admitting: Emergency Medicine

## 2021-03-11 ENCOUNTER — Other Ambulatory Visit (HOSPITAL_COMMUNITY): Payer: Self-pay

## 2021-03-13 ENCOUNTER — Other Ambulatory Visit (HOSPITAL_COMMUNITY): Payer: Self-pay

## 2021-03-13 ENCOUNTER — Other Ambulatory Visit (HOSPITAL_COMMUNITY): Payer: Self-pay | Admitting: Emergency Medicine

## 2021-03-13 MED ORDER — ONDANSETRON 4 MG PO TBDP
4.0000 mg | ORAL_TABLET | Freq: Three times a day (TID) | ORAL | 0 refills | Status: DC | PRN
Start: 2021-03-13 — End: 2021-03-15
  Filled 2021-03-13: qty 15, 5d supply, fill #0

## 2021-03-14 ENCOUNTER — Other Ambulatory Visit (HOSPITAL_COMMUNITY): Payer: Self-pay

## 2021-03-15 ENCOUNTER — Other Ambulatory Visit: Payer: Self-pay

## 2021-03-15 ENCOUNTER — Encounter: Payer: Self-pay | Admitting: Physician Assistant

## 2021-03-15 ENCOUNTER — Ambulatory Visit (INDEPENDENT_AMBULATORY_CARE_PROVIDER_SITE_OTHER): Payer: 59 | Admitting: Physician Assistant

## 2021-03-15 ENCOUNTER — Other Ambulatory Visit (HOSPITAL_COMMUNITY): Payer: Self-pay

## 2021-03-15 VITALS — BP 103/71 | HR 103 | Wt 163.0 lb

## 2021-03-15 DIAGNOSIS — G43709 Chronic migraine without aura, not intractable, without status migrainosus: Secondary | ICD-10-CM

## 2021-03-15 MED ORDER — ONDANSETRON 8 MG PO TBDP
8.0000 mg | ORAL_TABLET | Freq: Three times a day (TID) | ORAL | 1 refills | Status: DC | PRN
  Filled 2021-03-15: qty 20, 7d supply, fill #0

## 2021-03-15 MED ORDER — VENLAFAXINE HCL ER 37.5 MG PO CP24
112.5000 mg | ORAL_CAPSULE | Freq: Every day | ORAL | 3 refills | Status: DC
  Filled 2021-03-15: qty 90, 30d supply, fill #0
  Filled 2021-04-18: qty 90, 30d supply, fill #1
  Filled 2021-05-14: qty 90, 30d supply, fill #2
  Filled 2021-06-14: qty 90, 30d supply, fill #3

## 2021-03-15 MED ORDER — CYCLOBENZAPRINE HCL 10 MG PO TABS
10.0000 mg | ORAL_TABLET | Freq: Three times a day (TID) | ORAL | 3 refills | Status: DC | PRN
  Filled 2021-03-15: qty 40, 14d supply, fill #0
  Filled 2021-04-18: qty 40, 14d supply, fill #1

## 2021-03-15 NOTE — Patient Instructions (Signed)

## 2021-03-15 NOTE — Progress Notes (Signed)
History:  Megan Martin is a 60 y.o.  who presents to clinic today for followup of migraine.  She has only had one that kept her out of work for most of this past week.  She now recognizes that she was on 140mg  of Aimovig all along (so no increase from last visit).  She was able to switch from Pristiq to Effexor.   She is taking 75mg  effexor with no side effects.  Although she continues to feel headachy every day, her baseline is improved across the board.  She notes improvement.  She has found cyclobenzaprine to help with sleep.  She has needed zofran in addition to the sumatriptan.  HIT6:62 Number of days in the last 4 weeks with:  Severe headache: 3 Moderate headache: 3 Mild headache: 22  No headache: 0   Past Medical History:  Diagnosis Date  . Anemia   . Complication of anesthesia    Pt states she gets post-op N&V,dehydration,decreased B/P past sedation.She denies allergy to soy and eggs  . Depression   . Gallstones   . Hyperlipidemia   . Insomnia   . Migraine   . S/P partial hysterectomy   . UTI (lower urinary tract infection)     Social History   Socioeconomic History  . Marital status: Married    Spouse name: Not on file  . Number of children: Not on file  . Years of education: Not on file  . Highest education level: Not on file  Occupational History  . Not on file  Tobacco Use  . Smoking status: Never Smoker  . Smokeless tobacco: Never Used  Vaping Use  . Vaping Use: Never used  Substance and Sexual Activity  . Alcohol use: No  . Drug use: No  . Sexual activity: Not Currently    Birth control/protection: Surgical  Other Topics Concern  . 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 Is active, ambulatory without cane or walker. Divorced, has two children.    Social Determinants of Health   Financial Resource Strain: Not on file  Food Insecurity: Not on file  Transportation Needs: Not on  file  Physical Activity: Not on file  Stress: Not on file  Social Connections: Not on file  Intimate Partner Violence: Not on file    Family History  Problem Relation Age of Onset  . Heart disease Mother   . Hypertension Mother   . Heart disease Father   . Early death Father   . Colon polyps Father   . Hypertension Brother   . Hyperlipidemia Brother   . Heart disease Brother   . Colon polyps Brother   . Colon cancer Paternal Uncle   . Esophageal cancer Neg Hx   . Rectal cancer Neg Hx   . Stomach cancer Neg Hx     Allergies  Allergen Reactions  . Demerol Other (See Comments)    Swelling of arm  . Reglan [Metoclopramide]     Agitation and anxiousness    Current Outpatient Medications on File Prior to Visit  Medication Sig Dispense Refill  . clindamycin-benzoyl peroxide (BENZACLIN) gel SMARTSIG:Sparingly Topical Twice Daily    . clindamycin-benzoyl peroxide (BENZACLIN) gel APPLY SPARINGLY TO THE AFFECTED AREA TWICE DAILY. 25 g 3  . clonazePAM (KLONOPIN) 0.5 MG tablet TAKE 2 TABLETS BY MOUTH NIGHTLY AT BEDTIME AS NEEDED 60 tablet 2  . cyclobenzaprine (FLEXERIL) 10 MG tablet TAKE 1 TABLET (10  MG TOTAL) BY MOUTH EVERY 8 (EIGHT) HOURS AS NEEDED FOR MUSCLE SPASMS. 30 tablet 2  . Erenumab-aooe 140 MG/ML SOAJ INJECT 140 MG INTO THE SKIN EVERY 30 (THIRTY) DAYS. 1 mL 11  . estradiol (VIVELLE-DOT) 0.05 MG/24HR patch APPLY 1 PATCH TWICE A WEEK BY TRANSDERMAL ROUTE. 24 patch 3  . ondansetron (ZOFRAN-ODT) 4 MG disintegrating tablet Take 1 tablet (4 mg total) by mouth every 8 (eight) hours as needed. 15 tablet 0  . rOPINIRole (REQUIP) 0.5 MG tablet TAKE 1/2 TABLET BY MOUTH 1 HOUR BEFORE BEDTIME FOR 2 DAYS, THEN INCREASE TO 1 TABLET NIGHTLY 30 tablet 11  . SUMAtriptan (IMITREX) 100 MG tablet TAKE 1 TABLET (100 MG TOTAL) BY MOUTH ONCE AS NEEDED FOR UP TO 1 DOSE FOR MIGRAINE. MAY REPEAT IN 2 HOURS IF HEADACHE PERSISTS OR RECURS. 9 tablet 11  . venlafaxine XR (EFFEXOR-XR) 37.5 MG 24 hr capsule  TAKE 1 CAPSULE BY MOUTH DAILY FOR THE FIRST WEEK, THEN INCREASE TO 2 CAPSULES BY MOUTH DAILY WITH BREAKFAST 60 capsule 3  . venlafaxine XR (EFFEXOR-XR) 37.5 MG 24 hr capsule TAKE 1 CAPSULE (37.5 MG TOTAL) BY MOUTH DAILY WITH BREAKFAST. 60 capsule 3  . clonazePAM (KLONOPIN) 0.5 MG tablet Take 0.5-1 mg by mouth at bedtime.     . clonazePAM (KLONOPIN) 0.5 MG tablet TAKE 2 TABLET BY MOUTH ONCE NIGHTLY AT BEDTIME AS NEEDED. 60 tablet 2  . clonazePAM (KLONOPIN) 0.5 MG tablet Take 2 tablets (1 mg total) by mouth at bedtime. 60 tablet 2  . Erenumab-aooe 140 MG/ML SOAJ INJECT 140MG  UNDER THE SKIN ONCE A MONTH 3 mL 3  . estradiol (VIVELLE-DOT) 0.1 MG/24HR Place 1 patch onto the skin 2 (two) times a week.    . Na Sulfate-K Sulfate-Mg Sulf 17.5-3.13-1.6 GM/177ML SOLN TAKE AS DIRECTED. (Patient not taking: Reported on 03/15/2021) 354 mL 0  . ondansetron (ZOFRAN ODT) 4 MG disintegrating tablet 4mg  ODT q4 hours prn nausea/vomit (Patient not taking: Reported on 12/03/2020) 20 tablet 0  . rOPINIRole (REQUIP) 0.5 MG tablet Take 0.5 mg by mouth at bedtime.    Marland Kitchen rOPINIRole (REQUIP) 0.5 MG tablet TAKE 1/2 TABLET BY MOUTH 1 HOUR BEFORE BEDTIME FOR 2 DAYS, THEN INCREASE TO 1 TABLET NIGHTLY 30 tablet 6  . topiramate (TOPAMAX) 50 MG tablet Take 50 mg by mouth at bedtime. (Patient not taking: Reported on 12/03/2020)    . topiramate (TOPAMAX) 50 MG tablet TAKE 1 TABLET BY MOUTH DAILY AT BEDTIME 30 tablet 3   No current facility-administered medications on file prior to visit.     Review of Systems:  All pertinent positive/negative included in HPI, all other review of systems are negative   Objective:  Physical Exam BP 103/71   Pulse (!) 103   Wt 163 lb (73.9 kg)   BMI 29.81 kg/m  CONSTITUTIONAL: Well-developed, well-nourished female in no acute distress.  EYES: EOM intact ENT: Normocephalic CARDIOVASCULAR: Regular rate and rhythm with no adventitious sounds.  RESPIRATORY: Normal rate. MUSCULOSKELETAL: Normal  ROM SKIN: Warm, dry without erythema  NEUROLOGICAL: Alert, oriented, CN II-XII grossly intact, Appropriate balancePSYCH: Normal behavior, mood   Assessment & Plan:  Assessment: 1. Chronic migraine without aura without status migrainosus, not intractable   improving but still needing work   Plan: Increase Effexor to 3 caps each morning Continue aimovig monthly at 140mg  Sumatriptan for acute migraine Cyclobenzaprine prn sleep,  Follow-up in 3 months or sooner PRN  Paticia Stack, PA-C 03/15/2021 8:49 AM

## 2021-03-19 ENCOUNTER — Other Ambulatory Visit (HOSPITAL_COMMUNITY): Payer: Self-pay

## 2021-03-19 ENCOUNTER — Other Ambulatory Visit: Payer: Self-pay | Admitting: Physician Assistant

## 2021-03-20 ENCOUNTER — Other Ambulatory Visit (HOSPITAL_COMMUNITY): Payer: Self-pay

## 2021-03-21 ENCOUNTER — Other Ambulatory Visit (HOSPITAL_COMMUNITY): Payer: Self-pay

## 2021-03-21 ENCOUNTER — Other Ambulatory Visit: Payer: Self-pay | Admitting: Physician Assistant

## 2021-03-22 ENCOUNTER — Other Ambulatory Visit (HOSPITAL_COMMUNITY): Payer: Self-pay

## 2021-03-26 ENCOUNTER — Other Ambulatory Visit: Payer: Self-pay | Admitting: Physician Assistant

## 2021-03-26 ENCOUNTER — Other Ambulatory Visit (HOSPITAL_COMMUNITY): Payer: Self-pay

## 2021-03-27 ENCOUNTER — Other Ambulatory Visit: Payer: Self-pay | Admitting: Physician Assistant

## 2021-03-27 ENCOUNTER — Other Ambulatory Visit (HOSPITAL_COMMUNITY): Payer: Self-pay

## 2021-03-29 ENCOUNTER — Other Ambulatory Visit (HOSPITAL_COMMUNITY): Payer: Self-pay

## 2021-03-29 MED ORDER — ONDANSETRON 8 MG PO TBDP
8.0000 mg | ORAL_TABLET | Freq: Three times a day (TID) | ORAL | 1 refills | Status: DC | PRN
  Filled 2021-03-29: qty 20, 7d supply, fill #0

## 2021-03-31 IMAGING — CT CT ABD-PELV W/ CM
2 of 5 series · 16 of 46 positions shown, 18 images · IV contrast (Omnipaque)
Comparison: CT abdomen pelvis dated 01/18/2012.

CLINICAL DATA: 59-year-old female with abdominal pain. Concern for
acute diverticulitis.

EXAM:
CT ABDOMEN AND PELVIS WITH CONTRAST
TECHNIQUE: Multidetector CT imaging of the abdomen and pelvis was performed
using the standard protocol following bolus administration of
intravenous contrast.
CONTRAST:  100mL OMNIPAQUE IOHEXOL 300 MG/ML  SOLN

[Series 2: axial st · axial · 0.72mm/px · z∈[-429,-69]mm · 13 of 82 slices shown, 15 images]
[im 5/82  soft-tissue]
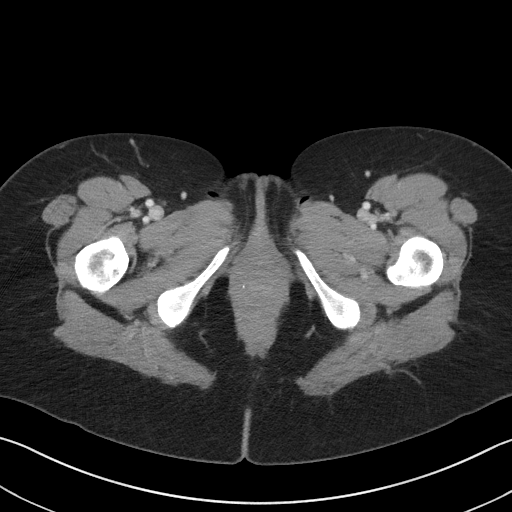
[im 5/82  bone]
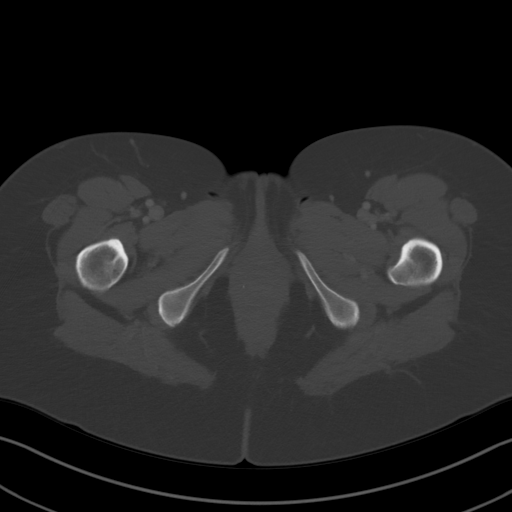
[im 13/82  soft-tissue]
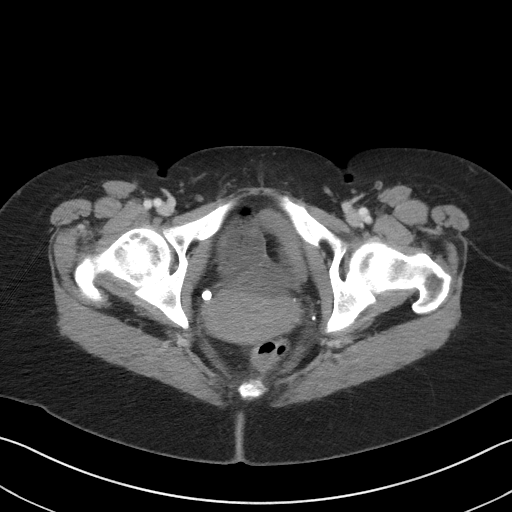
[im 18/82  soft-tissue]
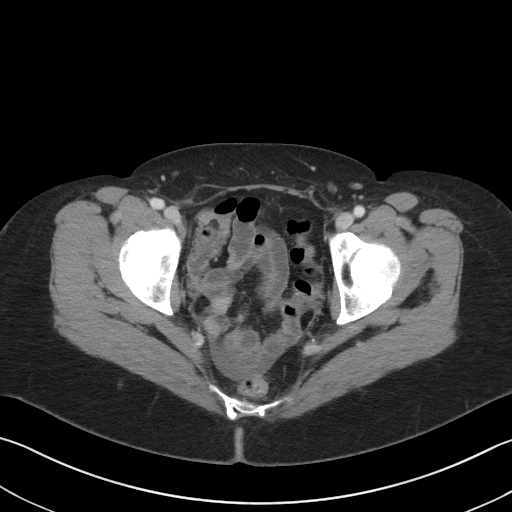
[im 22/82  soft-tissue]
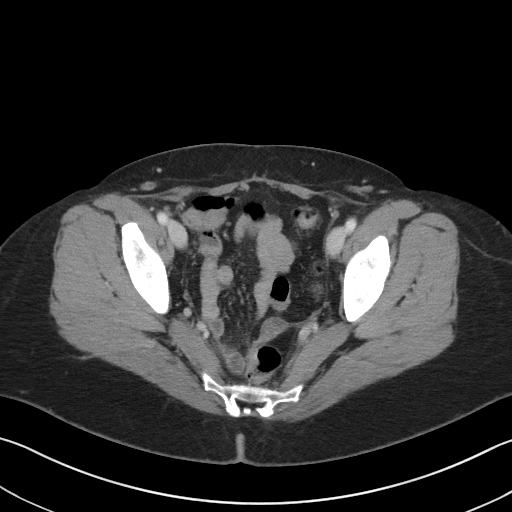
[im 30/82  soft-tissue]
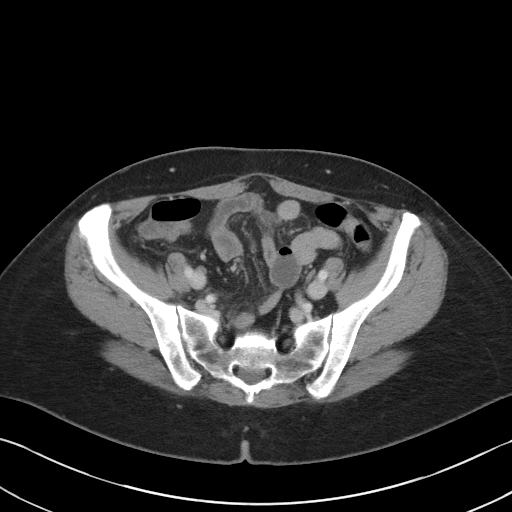
[im 35/82  soft-tissue]
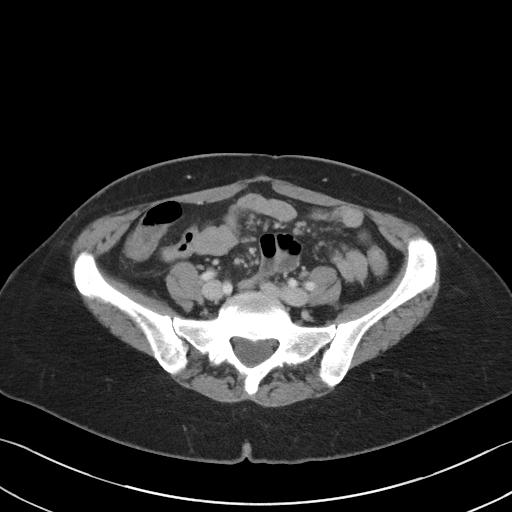
[im 43/82  soft-tissue]
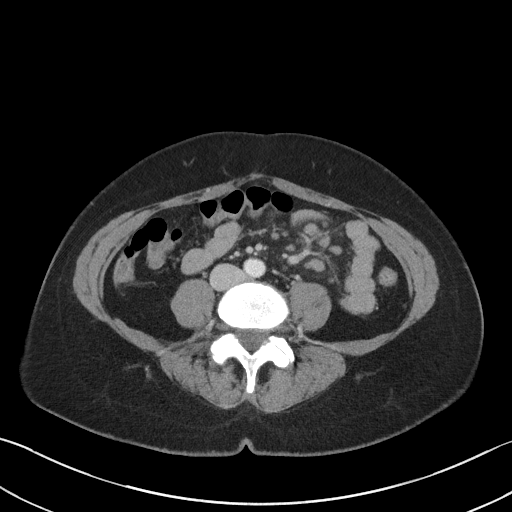
[im 47/82  soft-tissue]
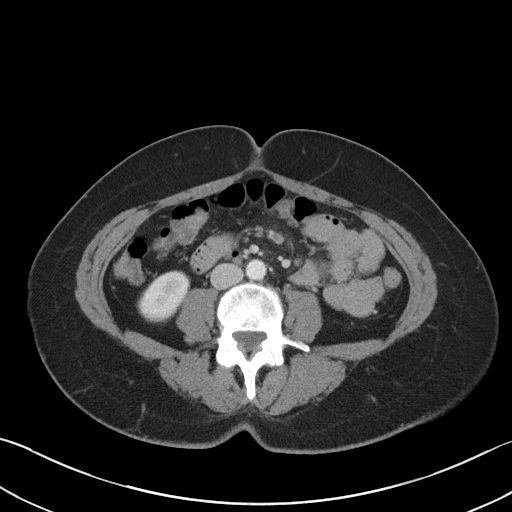
[im 52/82  soft-tissue]
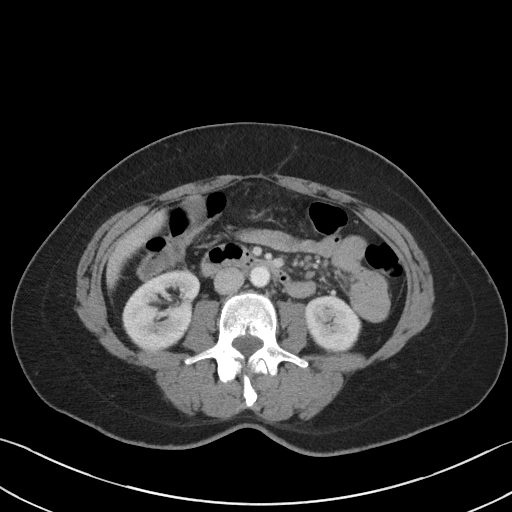
[im 52/82  bone]
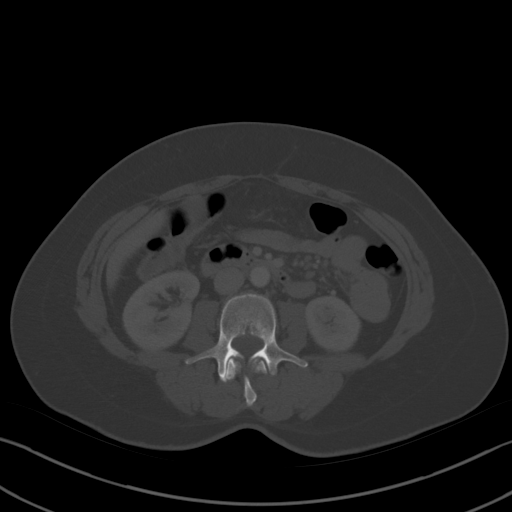
[im 60/82  soft-tissue]
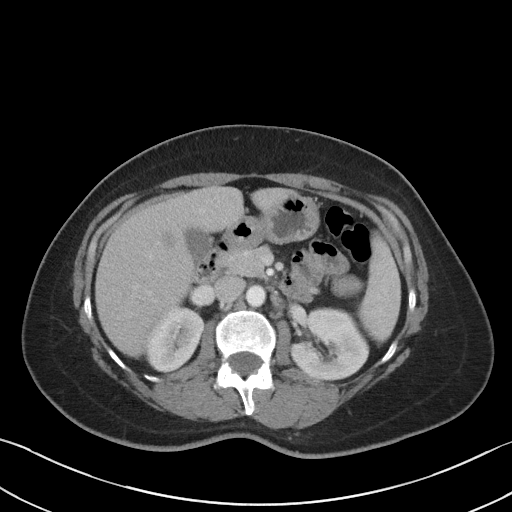
[im 64/82  soft-tissue]
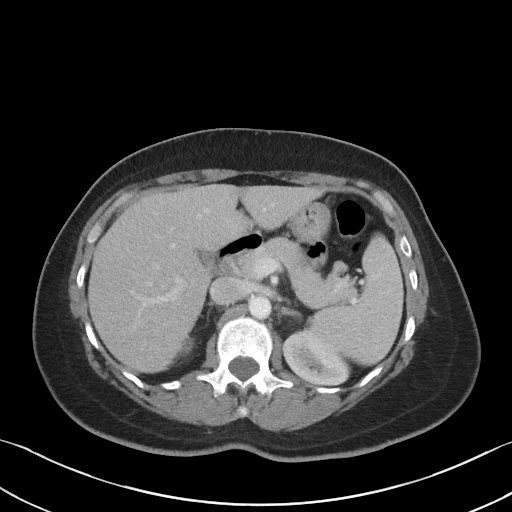
[im 69/82  soft-tissue]
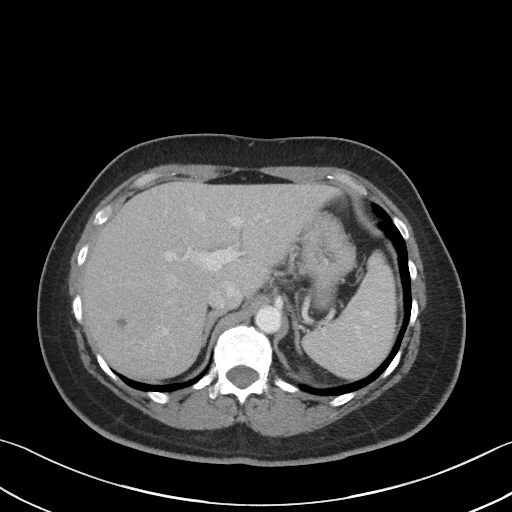
[im 77/82  soft-tissue]
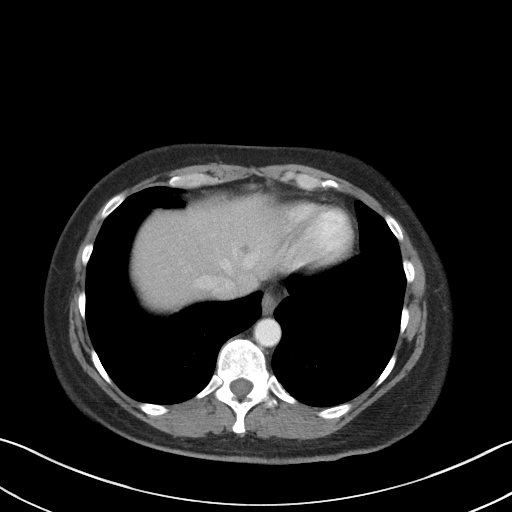

[Series 5: coronal st · coronal · 0.76mm/px · 3 of 77 slices shown]
[im 26/77  soft-tissue]
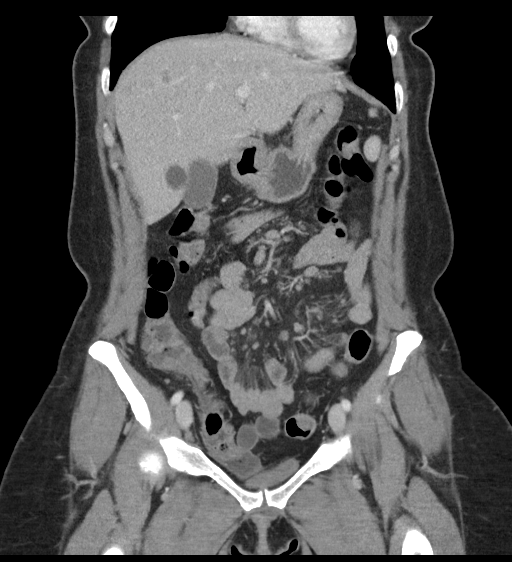
[im 34/77  soft-tissue]
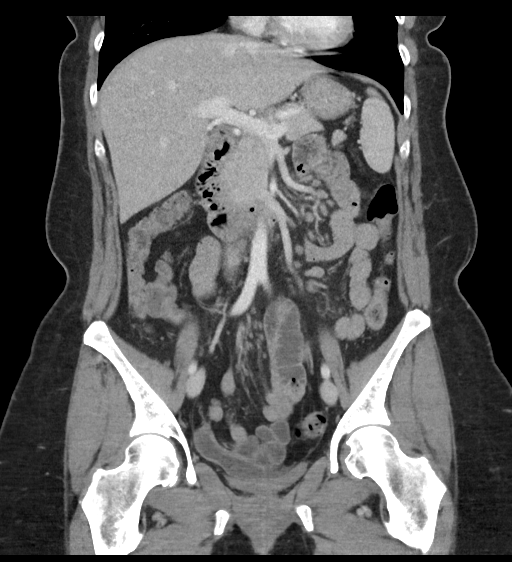
[im 43/77  soft-tissue]
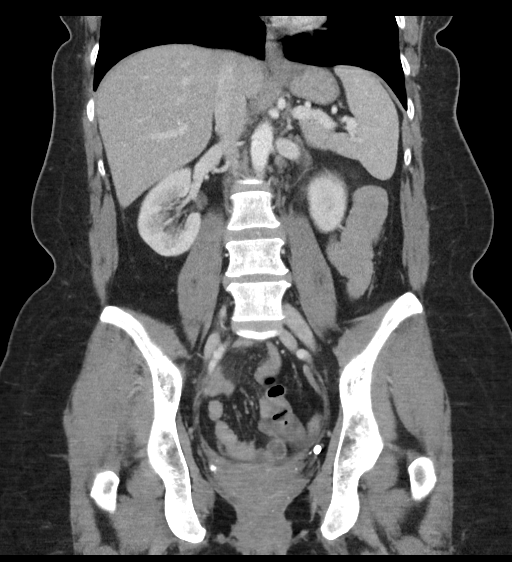

[16 of 46 positions shown; findings below may reference images not displayed]

FINDINGS: Lower chest: The visualized lung bases are clear.

No intra-abdominal free air. Small free fluid in the pelvis.

Hepatobiliary: Probable mild fatty liver. Multiple liver cysts
measure up to 2.7 cm. Several subcentimeter hypodense lesions are
too small to characterize. There is no intrahepatic biliary
dilatation. The gallbladder is unremarkable.

Pancreas: Unremarkable. No pancreatic ductal dilatation or
surrounding inflammatory changes.

Spleen: Normal in size without focal abnormality.

Adrenals/Urinary Tract: The adrenal glands unremarkable. There is no
hydronephrosis on either side. There is symmetric enhancement and
excretion of contrast by both kidneys. The visualized ureters appear
unremarkable. The urinary bladder is collapsed.

Stomach/Bowel: There is no bowel obstruction or active inflammation.
The appendix is not visualized with certainty. No inflammatory
changes identified in the right lower quadrant.

Vascular/Lymphatic: The abdominal aorta and IVC unremarkable. No
portal venous gas. There is mild diffuse mesenteric edema. Multiple
mildly enlarged and top-normal mesenteric lymph nodes, nonspecific,
likely reactive. No retroperitoneal adenopathy.

Reproductive: Hysterectomy. No adnexal masses.

Other: None

Musculoskeletal: No acute or significant osseous findings.
IMPRESSION: 1. Multiple mildly enlarged and top-normal mesenteric lymph nodes,
nonspecific, likely reactive. Findings may represent mesenteric
adenitis or panniculitis. Clinical correlation is recommended.
2. No bowel obstruction.

## 2021-04-03 ENCOUNTER — Other Ambulatory Visit (HOSPITAL_COMMUNITY): Payer: Self-pay

## 2021-04-03 MED FILL — Ropinirole Hydrochloride Tab 0.5 MG: ORAL | 30 days supply | Qty: 30 | Fill #2 | Status: AC

## 2021-04-03 MED FILL — Erenumab-aooe Subcutaneous Soln Auto-Injector 140 MG/ML: SUBCUTANEOUS | 30 days supply | Qty: 1 | Fill #2 | Status: AC

## 2021-04-15 ENCOUNTER — Telehealth: Payer: Self-pay | Admitting: Radiology

## 2021-04-15 NOTE — Telephone Encounter (Signed)
Left message for patient to call [facility] to schedule headache f/u in August. Also sent mychart message

## 2021-04-18 ENCOUNTER — Other Ambulatory Visit (HOSPITAL_COMMUNITY): Payer: Self-pay

## 2021-04-18 MED FILL — Ropinirole Hydrochloride Tab 0.5 MG: ORAL | 30 days supply | Qty: 30 | Fill #3 | Status: CN

## 2021-05-04 MED FILL — Ropinirole Hydrochloride Tab 0.5 MG: ORAL | 30 days supply | Qty: 30 | Fill #3 | Status: AC

## 2021-05-04 MED FILL — Erenumab-aooe Subcutaneous Soln Auto-Injector 140 MG/ML: SUBCUTANEOUS | 30 days supply | Qty: 1 | Fill #3 | Status: AC

## 2021-05-06 ENCOUNTER — Other Ambulatory Visit (HOSPITAL_COMMUNITY): Payer: Self-pay

## 2021-05-13 DIAGNOSIS — H5213 Myopia, bilateral: Secondary | ICD-10-CM | POA: Diagnosis not present

## 2021-05-14 ENCOUNTER — Other Ambulatory Visit (HOSPITAL_COMMUNITY): Payer: Self-pay

## 2021-05-15 ENCOUNTER — Other Ambulatory Visit (HOSPITAL_COMMUNITY): Payer: Self-pay

## 2021-05-16 ENCOUNTER — Other Ambulatory Visit (HOSPITAL_COMMUNITY): Payer: Self-pay

## 2021-05-16 MED ORDER — CLONAZEPAM 0.5 MG PO TABS
0.5000 mg | ORAL_TABLET | Freq: Every evening | ORAL | 0 refills | Status: DC | PRN
  Filled 2021-05-16: qty 60, 30d supply, fill #0

## 2021-05-20 ENCOUNTER — Encounter: Payer: Self-pay | Admitting: *Deleted

## 2021-05-31 IMAGING — US US BREAST*R* LIMITED INC AXILLA
1 series · 8 of 8 positions shown · non-contrast
Comparison: Previous exam(s).

CLINICAL DATA: Patient recalled from screening for right breast
mass.

EXAM:
DIGITAL DIAGNOSTIC UNILATERAL RIGHT MAMMOGRAM WITH TOMOSYNTHESIS AND
CAD; ULTRASOUND RIGHT BREAST LIMITED
TECHNIQUE: Right digital diagnostic mammography and breast tomosynthesis was
performed. The images were evaluated with computer-aided detection.;
Targeted ultrasound examination of the right breast was performed

[Series 1: us breast*right* limited inc axilla · 0.06mm/px · 8 of 8 slices shown]
[im 1/8]
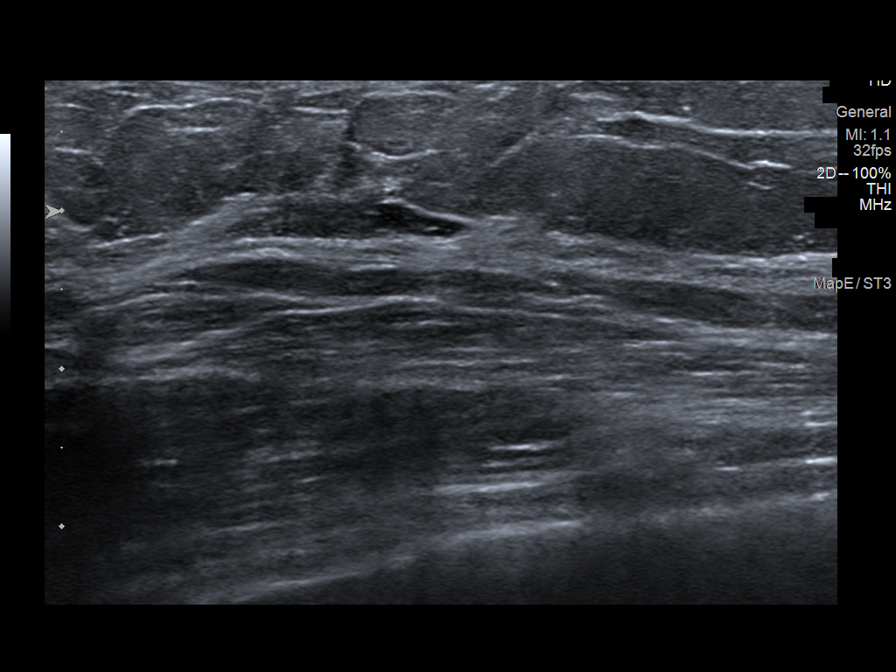
[im 2/8]
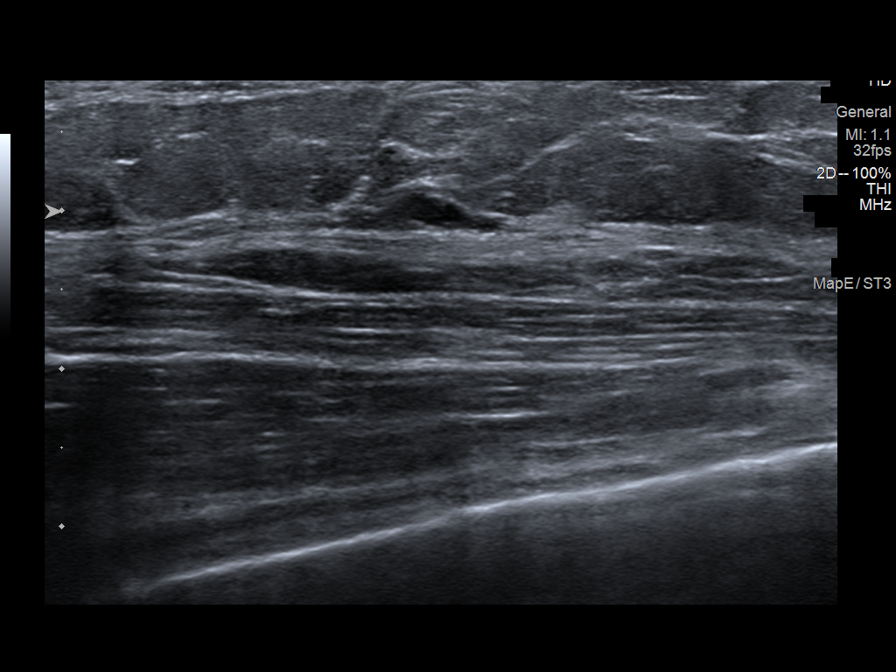
[im 3/8]
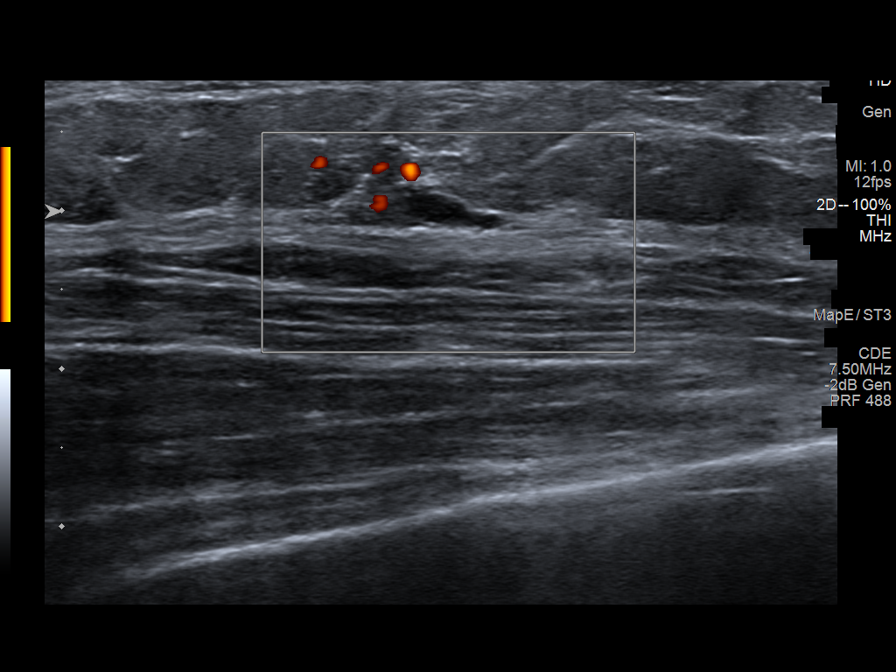
[im 4/8]
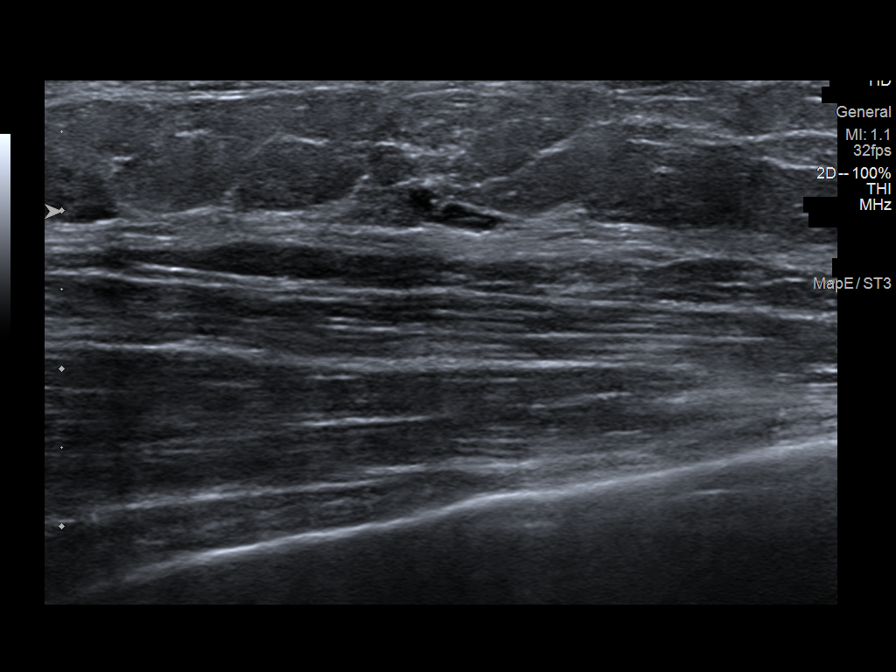
[im 5/8]
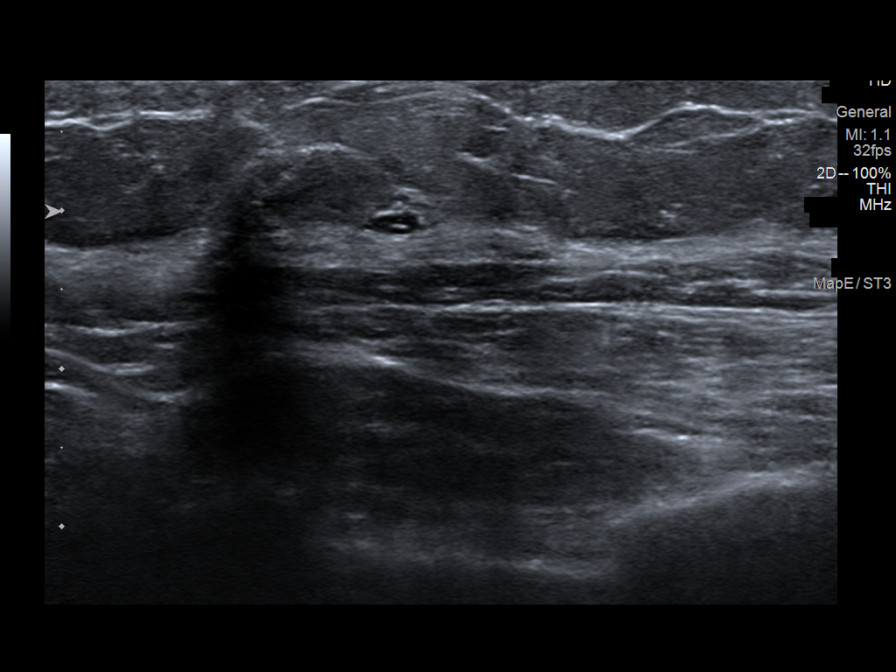
[im 6/8]
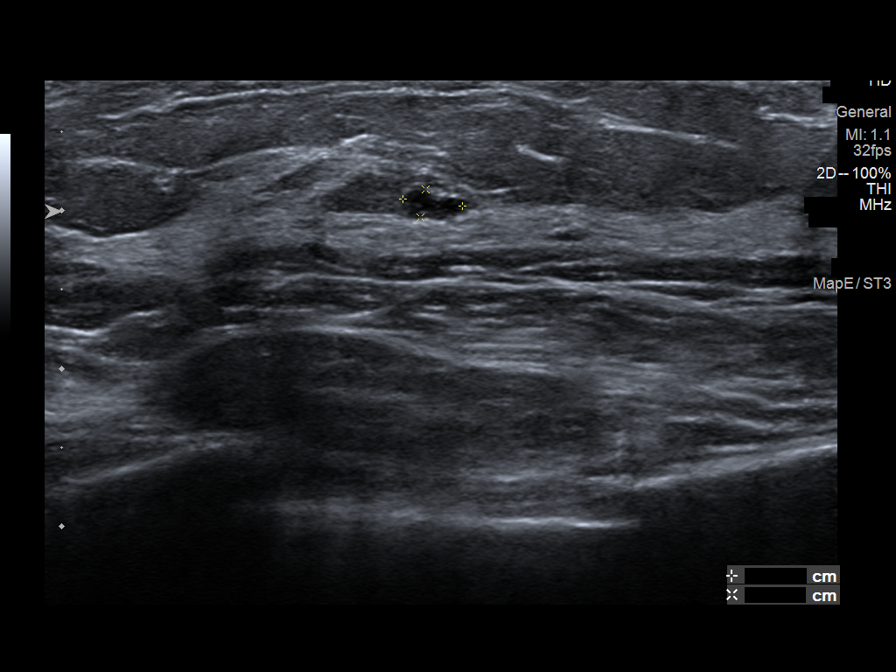
[im 7/8]
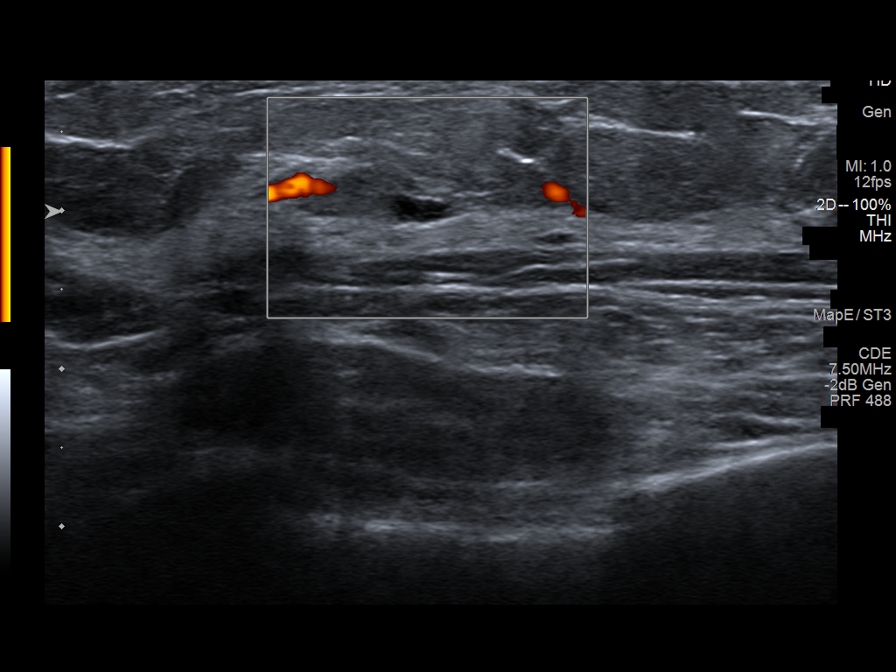
[im 8/8]
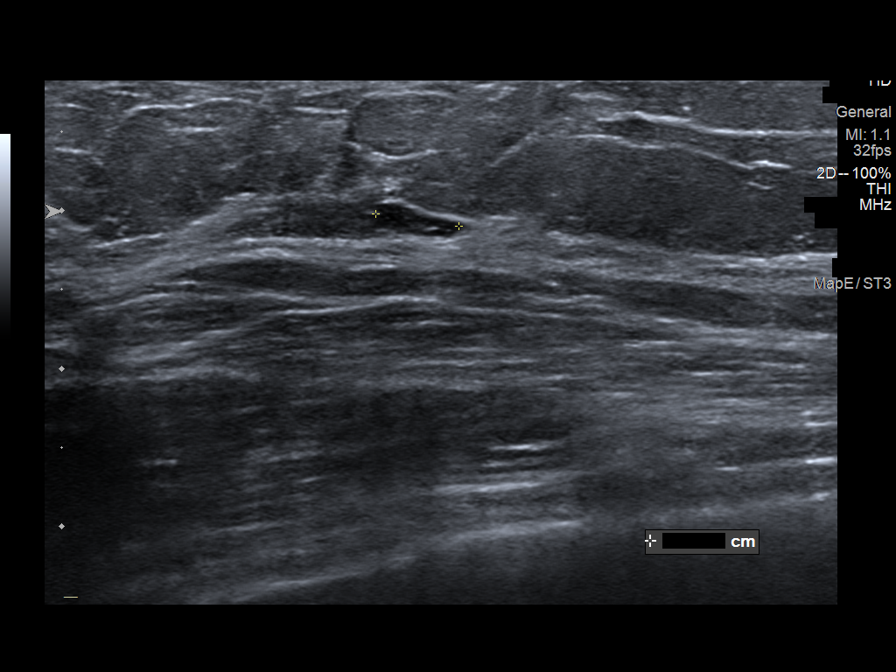

[8 of 8 positions shown; findings below may reference images not displayed]

ACR Breast Density Category c: The breast tissue is heterogeneously
dense, which may obscure small masses.
FINDINGS: Within the outer right breast middle depth there is a persistent
small oval circumscribed mass, further evaluated with additional
imaging.

Targeted ultrasound is performed, showing a 4 x 2 x 5 mm cluster of
cysts right breast 10 o'clock position 6 cm from nipple.
IMPRESSION: Right breast cluster of cysts. No mammographic evidence for
malignancy.

RECOMMENDATION:
Screening mammogram in one year.(Code:JL-[id number])

I have discussed the findings and recommendations with the patient.
If applicable, a reminder letter will be sent to the patient
regarding the next appointment.

BI-RADS CATEGORY  2: Benign.

## 2021-06-04 MED FILL — Ropinirole Hydrochloride Tab 0.5 MG: ORAL | 30 days supply | Qty: 30 | Fill #4 | Status: AC

## 2021-06-04 MED FILL — Erenumab-aooe Subcutaneous Soln Auto-Injector 140 MG/ML: SUBCUTANEOUS | 30 days supply | Qty: 1 | Fill #4 | Status: AC

## 2021-06-05 ENCOUNTER — Other Ambulatory Visit (HOSPITAL_COMMUNITY): Payer: Self-pay

## 2021-06-14 ENCOUNTER — Other Ambulatory Visit (HOSPITAL_COMMUNITY): Payer: Self-pay

## 2021-06-14 MED ORDER — CLONAZEPAM 0.5 MG PO TABS
0.5000 mg | ORAL_TABLET | Freq: Every evening | ORAL | 0 refills | Status: DC | PRN
  Filled 2021-06-14: qty 2, 1d supply, fill #0
  Filled 2021-06-17: qty 60, 30d supply, fill #0

## 2021-06-17 ENCOUNTER — Other Ambulatory Visit (HOSPITAL_COMMUNITY): Payer: Self-pay

## 2021-06-27 DIAGNOSIS — H57813 Brow ptosis, bilateral: Secondary | ICD-10-CM | POA: Diagnosis not present

## 2021-06-27 DIAGNOSIS — H02831 Dermatochalasis of right upper eyelid: Secondary | ICD-10-CM | POA: Diagnosis not present

## 2021-06-27 DIAGNOSIS — H53481 Generalized contraction of visual field, right eye: Secondary | ICD-10-CM | POA: Diagnosis not present

## 2021-06-27 DIAGNOSIS — H02834 Dermatochalasis of left upper eyelid: Secondary | ICD-10-CM | POA: Diagnosis not present

## 2021-07-04 MED FILL — Ropinirole Hydrochloride Tab 0.5 MG: ORAL | 30 days supply | Qty: 30 | Fill #5 | Status: AC

## 2021-07-04 MED FILL — Estradiol TD Patch Twice Weekly 0.05 MG/24HR: TRANSDERMAL | 84 days supply | Qty: 24 | Fill #0 | Status: AC

## 2021-07-04 MED FILL — Erenumab-aooe Subcutaneous Soln Auto-Injector 140 MG/ML: SUBCUTANEOUS | 30 days supply | Qty: 1 | Fill #5 | Status: AC

## 2021-07-05 ENCOUNTER — Other Ambulatory Visit (HOSPITAL_COMMUNITY): Payer: Self-pay

## 2021-07-16 ENCOUNTER — Other Ambulatory Visit (HOSPITAL_COMMUNITY): Payer: Self-pay

## 2021-07-16 ENCOUNTER — Other Ambulatory Visit: Payer: Self-pay | Admitting: Physician Assistant

## 2021-07-16 MED ORDER — VENLAFAXINE HCL ER 37.5 MG PO CP24
112.5000 mg | ORAL_CAPSULE | Freq: Every day | ORAL | 0 refills | Status: DC
  Filled 2021-07-16: qty 90, 30d supply, fill #0

## 2021-07-16 MED ORDER — CLONAZEPAM 0.5 MG PO TABS
0.5000 mg | ORAL_TABLET | Freq: Every evening | ORAL | 0 refills | Status: DC | PRN
  Filled 2021-07-16: qty 60, 30d supply, fill #0

## 2021-07-16 NOTE — Telephone Encounter (Signed)
Patient needs appointment asap

## 2021-07-17 DIAGNOSIS — H53481 Generalized contraction of visual field, right eye: Secondary | ICD-10-CM | POA: Diagnosis not present

## 2021-08-05 MED FILL — Erenumab-aooe Subcutaneous Soln Auto-Injector 140 MG/ML: SUBCUTANEOUS | 30 days supply | Qty: 1 | Fill #6 | Status: AC

## 2021-08-05 MED FILL — Ropinirole Hydrochloride Tab 0.5 MG: ORAL | 30 days supply | Qty: 30 | Fill #6 | Status: AC

## 2021-08-06 ENCOUNTER — Other Ambulatory Visit (HOSPITAL_COMMUNITY): Payer: Self-pay

## 2021-08-14 ENCOUNTER — Other Ambulatory Visit (HOSPITAL_COMMUNITY): Payer: Self-pay

## 2021-08-14 ENCOUNTER — Other Ambulatory Visit: Payer: Self-pay | Admitting: Physician Assistant

## 2021-08-15 ENCOUNTER — Other Ambulatory Visit (HOSPITAL_COMMUNITY): Payer: Self-pay

## 2021-08-15 MED ORDER — CLONAZEPAM 0.5 MG PO TABS
0.5000 mg | ORAL_TABLET | Freq: Every evening | ORAL | 2 refills | Status: DC | PRN
  Filled 2021-08-15: qty 60, 30d supply, fill #0
  Filled 2021-09-15: qty 60, 30d supply, fill #1
  Filled 2021-10-14: qty 60, 30d supply, fill #2

## 2021-08-16 ENCOUNTER — Other Ambulatory Visit (HOSPITAL_COMMUNITY): Payer: Self-pay

## 2021-08-16 MED ORDER — VENLAFAXINE HCL ER 37.5 MG PO CP24
112.5000 mg | ORAL_CAPSULE | Freq: Every day | ORAL | 0 refills | Status: DC
  Filled 2021-08-16: qty 90, 30d supply, fill #0

## 2021-08-19 ENCOUNTER — Other Ambulatory Visit (HOSPITAL_COMMUNITY): Payer: Self-pay

## 2021-08-19 ENCOUNTER — Other Ambulatory Visit: Payer: Self-pay | Admitting: *Deleted

## 2021-08-19 MED ORDER — VENLAFAXINE HCL ER 37.5 MG PO CP24
112.5000 mg | ORAL_CAPSULE | Freq: Every day | ORAL | 0 refills | Status: DC
  Filled 2021-08-19 – 2021-09-18 (×2): qty 90, 30d supply, fill #0

## 2021-09-08 ENCOUNTER — Other Ambulatory Visit (HOSPITAL_COMMUNITY): Payer: Self-pay

## 2021-09-10 ENCOUNTER — Other Ambulatory Visit (HOSPITAL_COMMUNITY): Payer: Self-pay

## 2021-09-10 MED FILL — Erenumab-aooe Subcutaneous Soln Auto-Injector 140 MG/ML: SUBCUTANEOUS | 30 days supply | Qty: 1 | Fill #7 | Status: AC

## 2021-09-11 ENCOUNTER — Other Ambulatory Visit (HOSPITAL_COMMUNITY): Payer: Self-pay

## 2021-09-11 MED ORDER — ROPINIROLE HCL 0.5 MG PO TABS
1.0000 mg | ORAL_TABLET | Freq: Every day | ORAL | 11 refills | Status: DC
  Filled 2021-09-11: qty 60, 30d supply, fill #0

## 2021-09-16 ENCOUNTER — Other Ambulatory Visit (HOSPITAL_COMMUNITY): Payer: Self-pay

## 2021-09-17 ENCOUNTER — Other Ambulatory Visit (HOSPITAL_COMMUNITY): Payer: Self-pay

## 2021-09-17 MED ORDER — ROPINIROLE HCL 0.5 MG PO TABS
1.0000 mg | ORAL_TABLET | Freq: Every evening | ORAL | 11 refills | Status: DC
  Filled 2021-09-17: qty 60, 30d supply, fill #0
  Filled 2021-10-14: qty 60, 30d supply, fill #1
  Filled 2021-12-15: qty 60, 30d supply, fill #2
  Filled 2022-03-09: qty 60, 30d supply, fill #3
  Filled 2022-04-09: qty 60, 30d supply, fill #4
  Filled 2022-05-26: qty 60, 30d supply, fill #5

## 2021-09-18 ENCOUNTER — Other Ambulatory Visit (HOSPITAL_COMMUNITY): Payer: Self-pay

## 2021-09-19 DIAGNOSIS — H02831 Dermatochalasis of right upper eyelid: Secondary | ICD-10-CM | POA: Diagnosis not present

## 2021-09-19 DIAGNOSIS — H02834 Dermatochalasis of left upper eyelid: Secondary | ICD-10-CM | POA: Diagnosis not present

## 2021-09-19 DIAGNOSIS — H53481 Generalized contraction of visual field, right eye: Secondary | ICD-10-CM | POA: Diagnosis not present

## 2021-09-20 ENCOUNTER — Encounter: Payer: Self-pay | Admitting: Physician Assistant

## 2021-09-20 ENCOUNTER — Telehealth (INDEPENDENT_AMBULATORY_CARE_PROVIDER_SITE_OTHER): Payer: 59 | Admitting: Physician Assistant

## 2021-09-20 ENCOUNTER — Other Ambulatory Visit: Payer: Self-pay

## 2021-09-20 ENCOUNTER — Other Ambulatory Visit (HOSPITAL_COMMUNITY): Payer: Self-pay

## 2021-09-20 DIAGNOSIS — G43009 Migraine without aura, not intractable, without status migrainosus: Secondary | ICD-10-CM

## 2021-09-20 MED ORDER — SUMATRIPTAN SUCCINATE 100 MG PO TABS
ORAL_TABLET | ORAL | 3 refills | Status: DC
  Filled 2021-09-20 – 2022-08-24 (×2): qty 9, 30d supply, fill #0

## 2021-09-20 MED ORDER — CYCLOBENZAPRINE HCL 10 MG PO TABS
10.0000 mg | ORAL_TABLET | Freq: Three times a day (TID) | ORAL | 3 refills | Status: DC | PRN
  Filled 2021-09-20: qty 40, 14d supply, fill #0

## 2021-09-20 MED ORDER — ERENUMAB-AOOE 140 MG/ML ~~LOC~~ SOAJ
140.0000 mg | SUBCUTANEOUS | 11 refills | Status: DC
  Filled 2021-09-20: qty 1, fill #0
  Filled 2021-10-14: qty 1, 30d supply, fill #0
  Filled 2021-11-12: qty 1, 30d supply, fill #1
  Filled 2021-12-15: qty 1, 30d supply, fill #2
  Filled 2022-01-09: qty 1, 30d supply, fill #3
  Filled 2022-02-17: qty 1, 30d supply, fill #4
  Filled 2022-03-12 – 2022-03-13 (×2): qty 1, 30d supply, fill #5
  Filled 2022-04-09: qty 1, 30d supply, fill #6
  Filled 2022-05-06: qty 1, 30d supply, fill #7
  Filled 2022-06-02: qty 1, 30d supply, fill #8
  Filled 2022-06-27: qty 1, 30d supply, fill #9
  Filled 2022-07-29: qty 1, 30d supply, fill #10
  Filled 2022-08-24: qty 1, 30d supply, fill #11

## 2021-09-20 MED ORDER — VENLAFAXINE HCL ER 37.5 MG PO CP24
112.5000 mg | ORAL_CAPSULE | Freq: Every day | ORAL | 3 refills | Status: DC
  Filled 2021-09-20 (×2): qty 270, 90d supply, fill #0
  Filled 2021-12-15: qty 270, 90d supply, fill #1
  Filled 2022-03-09: qty 270, 90d supply, fill #2
  Filled 2022-06-12: qty 270, 90d supply, fill #3

## 2021-09-20 NOTE — Progress Notes (Signed)
TELEHEALTH HEADACHE VISIT ENCOUNTER NOTE  Provider location: [facility] [place] [facility]   Patient location: Home  I connected with Megan Martin on 09/20/21 at 11:00 AM EST by telephone and verified that I am speaking with the correct person using two identifiers. Patient was unable to do MyChart audiovisual encounter due to technical difficulties, she tried several times.    I discussed the limitations, risks, security and privacy concerns of performing an evaluation and management service by telephone and the availability of in person appointments. I also discussed with the patient that there may be a patient responsible charge related to this service. The patient expressed understanding and agreed to proceed.   History:  Megan Martin is a 60 y.o.  female being evaluated today for headache followup. She is using 3 capsules of venlafaxine each day.  She is having no significant migraines lately!  She had a sinus infection that resulted in headaches but otherwise she has had a great response to her migraine medications.  She has used just a few sumatriptan and cyclobenzaprine because she hasn't needed any more than that.   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      Past Medical History:  Diagnosis Date   Anemia    Complication of anesthesia    Pt states she gets post-op N&V,dehydration,decreased B/P past sedation.She denies allergy to soy and eggs   Depression    Gallstones    Hyperlipidemia    Insomnia    Migraine    S/P partial hysterectomy    UTI (lower urinary tract infection)    Past Surgical History:  Procedure Laterality Date   ABDOMINAL HYSTERECTOMY     BLEPHAROPLASTY     bil lower   CESAREAN SECTION  1982, 1984   COLONOSCOPY  05/01/2015   LAPAROSCOPIC HYSTERECTOMY     still has cervix, ovaries, 1 tube   MYOMECTOMY     POLYPECTOMY     sinus irrigations      two times /1968   TONSILLECTOMY     WISDOM TOOTH EXTRACTION     The following portions of the patient's history were reviewed and updated as appropriate: allergies, current medications, past family history, past medical history, past social history, past surgical history and problem list.     Review of Systems:  Pertinent items noted in HPI and remainder of comprehensive ROS otherwise negative.  Physical Exam:   General:  Alert, oriented and cooperative.   Mental Status: Normal mood and affect perceived. Normal judgment and thought content.  Physical exam deferred due to nature of the encounter  Labs and Imaging No results found for this or any previous visit (from the past 336 hour(s)). No results found.    Assessment and Plan:     Stable migraine  Continue Effexor 112.5mg  daily for migraine prevention.  Sumatriptan for acute migraine Flexeril for headache and/or muscle spasm RTC PRN or 12 months.   I discussed the assessment and treatment plan with the patient. The patient was provided an opportunity to ask questions and all were answered. The patient agreed with the plan and demonstrated an understanding of the instructions.   The patient was advised to call back or seek an in-person evaluation/go to the ED if the symptoms worsen or if the condition fails to improve as anticipated.  I provided 22 minutes of non-face-to-face time during this  encounter.   Paticia Stack, PA-C [facility], [place]

## 2021-09-20 NOTE — Patient Instructions (Signed)
Migraine Headache A migraine headache is an intense, throbbing pain on one side or both sides of the head. Migraine headaches may also cause other symptoms, such as nausea, vomiting, and sensitivity to light and noise. A migraine headache can last from 4 hours to 3 days. Talk with your doctor about what things may bring on (trigger) your migraine headaches. What are the causes? The exact cause of this condition is not known. However, a migraine may be caused when nerves in the brain become irritated and release chemicals that cause inflammation of blood vessels. This inflammation causes pain. This condition may be triggered or caused by: Drinking alcohol. Smoking. Taking medicines, such as: Medicine used to treat chest pain (nitroglycerin). Birth control pills. Estrogen. Certain blood pressure medicines. Eating or drinking products that contain nitrates, glutamate, aspartame, or tyramine. Aged cheeses, chocolate, or caffeine may also be triggers. Doing physical activity. Other things that may trigger a migraine headache include: Menstruation. Pregnancy. Hunger. Stress. Lack of sleep or too much sleep. Weather changes. Fatigue. What increases the risk? The following factors may make you more likely to experience migraine headaches: Being a certain age. This condition is more common in people who are 25-55 years old. Being female. Having a family history of migraine headaches. Being Caucasian. Having a mental health condition, such as depression or anxiety. Being obese. What are the signs or symptoms? The main symptom of this condition is pulsating or throbbing pain. This pain may: Happen in any area of the head, such as on one side or both sides. Interfere with daily activities. Get worse with physical activity. Get worse with exposure to bright lights or loud noises. Other symptoms may include: Nausea. Vomiting. Dizziness. General sensitivity to bright lights, loud noises, or  smells. Before you get a migraine headache, you may get warning signs (an aura). An aura may include: Seeing flashing lights or having blind spots. Seeing bright spots, halos, or zigzag lines. Having tunnel vision or blurred vision. Having numbness or a tingling feeling. Having trouble talking. Having muscle weakness. Some people have symptoms after a migraine headache (postdromal phase), such as: Feeling tired. Difficulty concentrating. How is this diagnosed? A migraine headache can be diagnosed based on: Your symptoms. A physical exam. Tests, such as: CT scan or an MRI of the head. These imaging tests can help rule out other causes of headaches. Taking fluid from the spine (lumbar puncture) and analyzing it (cerebrospinal fluid analysis, or CSF analysis). How is this treated? This condition may be treated with medicines that: Relieve pain. Relieve nausea. Prevent migraine headaches. Treatment for this condition may also include: Acupuncture. Lifestyle changes like avoiding foods that trigger migraine headaches. Biofeedback. Cognitive behavioral therapy. Follow these instructions at home: Medicines Take over-the-counter and prescription medicines only as told by your health care provider. Ask your health care provider if the medicine prescribed to you: Requires you to avoid driving or using heavy machinery. Can cause constipation. You may need to take these actions to prevent or treat constipation: Drink enough fluid to keep your urine pale yellow. Take over-the-counter or prescription medicines. Eat foods that are high in fiber, such as beans, whole grains, and fresh fruits and vegetables. Limit foods that are high in fat and processed sugars, such as fried or sweet foods. Lifestyle Do not drink alcohol. Do not use any products that contain nicotine or tobacco, such as cigarettes, e-cigarettes, and chewing tobacco. If you need help quitting, ask your health care  provider. Get at least 8   hours of sleep every night. Find ways to manage stress, such as meditation, deep breathing, or yoga. General instructions   Keep a journal to find out what may trigger your migraine headaches. For example, write down: What you eat and drink. How much sleep you get. Any change to your diet or medicines. If you have a migraine headache: Avoid things that make your symptoms worse, such as bright lights. It may help to lie down in a dark, quiet room. Do not drive or use heavy machinery. Ask your health care provider what activities are safe for you while you are experiencing symptoms. Keep all follow-up visits as told by your health care provider. This is important. Contact a health care provider if: You develop symptoms that are different or more severe than your usual migraine headache symptoms. You have more than 15 headache days in one month. Get help right away if: Your migraine headache becomes severe. Your migraine headache lasts longer than 72 hours. You have a fever. You have a stiff neck. You have vision loss. Your muscles feel weak or like you cannot control them. You start to lose your balance often. You have trouble walking. You faint. You have a seizure. Summary A migraine headache is an intense, throbbing pain on one side or both sides of the head. Migraines may also cause other symptoms, such as nausea, vomiting, and sensitivity to light and noise. This condition may be treated with medicines and lifestyle changes. You may also need to avoid certain things that trigger a migraine headache. Keep a journal to find out what may trigger your migraine headaches. Contact your health care provider if you have more than 15 headache days in a month or you develop symptoms that are different or more severe than your usual migraine headache symptoms. This information is not intended to replace advice given to you by your health care provider. Make sure you  discuss any questions you have with your health care provider. Document Revised: 01/28/2019 Document Reviewed: 11/18/2018 Elsevier Patient Education  2022 [employer] Inc.  

## 2021-09-30 ENCOUNTER — Other Ambulatory Visit (HOSPITAL_COMMUNITY): Payer: Self-pay

## 2021-10-04 DIAGNOSIS — M8589 Other specified disorders of bone density and structure, multiple sites: Secondary | ICD-10-CM | POA: Diagnosis not present

## 2021-10-04 DIAGNOSIS — E785 Hyperlipidemia, unspecified: Secondary | ICD-10-CM | POA: Diagnosis not present

## 2021-10-04 DIAGNOSIS — M859 Disorder of bone density and structure, unspecified: Secondary | ICD-10-CM | POA: Diagnosis not present

## 2021-10-15 ENCOUNTER — Other Ambulatory Visit (HOSPITAL_COMMUNITY): Payer: Self-pay

## 2021-10-15 DIAGNOSIS — Z1339 Encounter for screening examination for other mental health and behavioral disorders: Secondary | ICD-10-CM | POA: Diagnosis not present

## 2021-10-15 DIAGNOSIS — Z0000 Encounter for general adult medical examination without abnormal findings: Secondary | ICD-10-CM | POA: Diagnosis not present

## 2021-10-15 DIAGNOSIS — G43909 Migraine, unspecified, not intractable, without status migrainosus: Secondary | ICD-10-CM | POA: Diagnosis not present

## 2021-10-15 DIAGNOSIS — G4700 Insomnia, unspecified: Secondary | ICD-10-CM | POA: Diagnosis not present

## 2021-10-15 DIAGNOSIS — F325 Major depressive disorder, single episode, in full remission: Secondary | ICD-10-CM | POA: Diagnosis not present

## 2021-10-15 DIAGNOSIS — E669 Obesity, unspecified: Secondary | ICD-10-CM | POA: Diagnosis not present

## 2021-10-15 DIAGNOSIS — K8020 Calculus of gallbladder without cholecystitis without obstruction: Secondary | ICD-10-CM | POA: Diagnosis not present

## 2021-10-15 DIAGNOSIS — G2581 Restless legs syndrome: Secondary | ICD-10-CM | POA: Diagnosis not present

## 2021-10-15 DIAGNOSIS — R82998 Other abnormal findings in urine: Secondary | ICD-10-CM | POA: Diagnosis not present

## 2021-10-15 DIAGNOSIS — M8580 Other specified disorders of bone density and structure, unspecified site: Secondary | ICD-10-CM | POA: Diagnosis not present

## 2021-10-15 DIAGNOSIS — Z1331 Encounter for screening for depression: Secondary | ICD-10-CM | POA: Diagnosis not present

## 2021-10-15 DIAGNOSIS — E785 Hyperlipidemia, unspecified: Secondary | ICD-10-CM | POA: Diagnosis not present

## 2021-10-15 MED ORDER — CLONAZEPAM 0.5 MG PO TABS
0.5000 mg | ORAL_TABLET | Freq: Every evening | ORAL | 5 refills | Status: DC | PRN
  Filled 2021-10-15 – 2021-11-12 (×2): qty 60, 30d supply, fill #0
  Filled 2021-12-15: qty 60, 30d supply, fill #1
  Filled 2022-01-09 – 2022-01-13 (×2): qty 60, 30d supply, fill #2
  Filled 2022-02-16: qty 60, 30d supply, fill #3
  Filled 2022-03-17: qty 60, 30d supply, fill #4
  Filled 2022-04-13: qty 60, 30d supply, fill #5

## 2021-11-12 ENCOUNTER — Other Ambulatory Visit (HOSPITAL_COMMUNITY): Payer: Self-pay

## 2021-11-13 ENCOUNTER — Other Ambulatory Visit (HOSPITAL_COMMUNITY): Payer: Self-pay

## 2021-12-13 DIAGNOSIS — R635 Abnormal weight gain: Secondary | ICD-10-CM | POA: Diagnosis not present

## 2021-12-13 DIAGNOSIS — Z6833 Body mass index (BMI) 33.0-33.9, adult: Secondary | ICD-10-CM | POA: Diagnosis not present

## 2021-12-16 ENCOUNTER — Other Ambulatory Visit (HOSPITAL_COMMUNITY): Payer: Self-pay

## 2022-01-09 DIAGNOSIS — E669 Obesity, unspecified: Secondary | ICD-10-CM | POA: Diagnosis not present

## 2022-01-09 DIAGNOSIS — R635 Abnormal weight gain: Secondary | ICD-10-CM | POA: Diagnosis not present

## 2022-01-09 DIAGNOSIS — E785 Hyperlipidemia, unspecified: Secondary | ICD-10-CM | POA: Diagnosis not present

## 2022-01-09 DIAGNOSIS — R000 Tachycardia, unspecified: Secondary | ICD-10-CM | POA: Diagnosis not present

## 2022-01-09 DIAGNOSIS — G4700 Insomnia, unspecified: Secondary | ICD-10-CM | POA: Diagnosis not present

## 2022-01-09 DIAGNOSIS — Z6833 Body mass index (BMI) 33.0-33.9, adult: Secondary | ICD-10-CM | POA: Diagnosis not present

## 2022-01-09 DIAGNOSIS — Z79899 Other long term (current) drug therapy: Secondary | ICD-10-CM | POA: Diagnosis not present

## 2022-01-10 ENCOUNTER — Other Ambulatory Visit (HOSPITAL_COMMUNITY): Payer: Self-pay

## 2022-01-10 MED ORDER — LOMAIRA 8 MG PO TABS
0.5000 | ORAL_TABLET | Freq: Every day | ORAL | 0 refills | Status: DC | PRN
  Filled 2022-01-10: qty 30, 30d supply, fill #0

## 2022-01-13 ENCOUNTER — Other Ambulatory Visit (HOSPITAL_COMMUNITY): Payer: Self-pay

## 2022-01-23 DIAGNOSIS — E669 Obesity, unspecified: Secondary | ICD-10-CM | POA: Diagnosis not present

## 2022-01-23 DIAGNOSIS — E785 Hyperlipidemia, unspecified: Secondary | ICD-10-CM | POA: Diagnosis not present

## 2022-01-23 DIAGNOSIS — Z713 Dietary counseling and surveillance: Secondary | ICD-10-CM | POA: Diagnosis not present

## 2022-01-23 DIAGNOSIS — Z6831 Body mass index (BMI) 31.0-31.9, adult: Secondary | ICD-10-CM | POA: Diagnosis not present

## 2022-01-29 ENCOUNTER — Other Ambulatory Visit (HOSPITAL_COMMUNITY): Payer: Self-pay

## 2022-01-29 DIAGNOSIS — Z6831 Body mass index (BMI) 31.0-31.9, adult: Secondary | ICD-10-CM | POA: Diagnosis not present

## 2022-01-29 DIAGNOSIS — Z1231 Encounter for screening mammogram for malignant neoplasm of breast: Secondary | ICD-10-CM | POA: Diagnosis not present

## 2022-01-29 DIAGNOSIS — Z01419 Encounter for gynecological examination (general) (routine) without abnormal findings: Secondary | ICD-10-CM | POA: Diagnosis not present

## 2022-01-29 MED ORDER — ESTRADIOL 0.025 MG/24HR TD PTTW
1.0000 | MEDICATED_PATCH | TRANSDERMAL | 3 refills | Status: DC
Start: 2022-01-30 — End: 2022-04-24
  Filled 2022-01-29: qty 8, 28d supply, fill #0

## 2022-02-03 ENCOUNTER — Other Ambulatory Visit: Payer: Self-pay | Admitting: Obstetrics and Gynecology

## 2022-02-03 DIAGNOSIS — R928 Other abnormal and inconclusive findings on diagnostic imaging of breast: Secondary | ICD-10-CM

## 2022-02-06 ENCOUNTER — Other Ambulatory Visit (HOSPITAL_COMMUNITY): Payer: Self-pay

## 2022-02-07 DIAGNOSIS — E669 Obesity, unspecified: Secondary | ICD-10-CM | POA: Diagnosis not present

## 2022-02-07 DIAGNOSIS — Z713 Dietary counseling and surveillance: Secondary | ICD-10-CM | POA: Diagnosis not present

## 2022-02-10 DIAGNOSIS — Z713 Dietary counseling and surveillance: Secondary | ICD-10-CM | POA: Diagnosis not present

## 2022-02-10 DIAGNOSIS — E669 Obesity, unspecified: Secondary | ICD-10-CM | POA: Diagnosis not present

## 2022-02-10 DIAGNOSIS — E785 Hyperlipidemia, unspecified: Secondary | ICD-10-CM | POA: Diagnosis not present

## 2022-02-17 ENCOUNTER — Ambulatory Visit
Admission: RE | Admit: 2022-02-17 | Discharge: 2022-02-17 | Disposition: A | Discharge status: Home / Self Care | Payer: 59 | Source: Ambulatory Visit | Attending: Obstetrics and Gynecology | Admitting: Obstetrics and Gynecology

## 2022-02-17 ENCOUNTER — Other Ambulatory Visit: Payer: Self-pay | Admitting: Obstetrics and Gynecology

## 2022-02-17 ENCOUNTER — Other Ambulatory Visit (HOSPITAL_COMMUNITY): Payer: Self-pay

## 2022-02-17 DIAGNOSIS — N6310 Unspecified lump in the right breast, unspecified quadrant: Secondary | ICD-10-CM

## 2022-02-17 DIAGNOSIS — R928 Other abnormal and inconclusive findings on diagnostic imaging of breast: Secondary | ICD-10-CM

## 2022-02-17 DIAGNOSIS — N6324 Unspecified lump in the left breast, lower inner quadrant: Secondary | ICD-10-CM | POA: Diagnosis not present

## 2022-02-17 DIAGNOSIS — N6320 Unspecified lump in the left breast, unspecified quadrant: Secondary | ICD-10-CM

## 2022-02-17 DIAGNOSIS — N6323 Unspecified lump in the left breast, lower outer quadrant: Secondary | ICD-10-CM | POA: Diagnosis not present

## 2022-02-17 DIAGNOSIS — N6001 Solitary cyst of right breast: Secondary | ICD-10-CM | POA: Diagnosis not present

## 2022-02-25 ENCOUNTER — Ambulatory Visit
Admission: RE | Admit: 2022-02-25 | Discharge: 2022-02-25 | Disposition: A | Discharge status: Home / Self Care | Payer: 59 | Source: Ambulatory Visit | Attending: Obstetrics and Gynecology | Admitting: Obstetrics and Gynecology

## 2022-02-25 ENCOUNTER — Other Ambulatory Visit: Payer: Self-pay | Admitting: Obstetrics and Gynecology

## 2022-02-25 DIAGNOSIS — N6320 Unspecified lump in the left breast, unspecified quadrant: Secondary | ICD-10-CM

## 2022-02-25 DIAGNOSIS — N6012 Diffuse cystic mastopathy of left breast: Secondary | ICD-10-CM | POA: Diagnosis not present

## 2022-02-25 DIAGNOSIS — N6314 Unspecified lump in the right breast, lower inner quadrant: Secondary | ICD-10-CM | POA: Diagnosis not present

## 2022-02-25 DIAGNOSIS — N6324 Unspecified lump in the left breast, lower inner quadrant: Secondary | ICD-10-CM | POA: Diagnosis not present

## 2022-02-25 DIAGNOSIS — N6011 Diffuse cystic mastopathy of right breast: Secondary | ICD-10-CM | POA: Diagnosis not present

## 2022-02-25 DIAGNOSIS — N6310 Unspecified lump in the right breast, unspecified quadrant: Secondary | ICD-10-CM

## 2022-03-10 ENCOUNTER — Other Ambulatory Visit (HOSPITAL_COMMUNITY): Payer: Self-pay

## 2022-03-10 DIAGNOSIS — E669 Obesity, unspecified: Secondary | ICD-10-CM | POA: Diagnosis not present

## 2022-03-10 DIAGNOSIS — Z713 Dietary counseling and surveillance: Secondary | ICD-10-CM | POA: Diagnosis not present

## 2022-03-10 DIAGNOSIS — E785 Hyperlipidemia, unspecified: Secondary | ICD-10-CM | POA: Diagnosis not present

## 2022-03-12 ENCOUNTER — Other Ambulatory Visit (HOSPITAL_COMMUNITY): Payer: Self-pay

## 2022-03-13 ENCOUNTER — Other Ambulatory Visit (HOSPITAL_COMMUNITY): Payer: Self-pay

## 2022-03-18 ENCOUNTER — Other Ambulatory Visit (HOSPITAL_COMMUNITY): Payer: Self-pay

## 2022-03-19 DIAGNOSIS — B360 Pityriasis versicolor: Secondary | ICD-10-CM | POA: Diagnosis not present

## 2022-03-19 DIAGNOSIS — D225 Melanocytic nevi of trunk: Secondary | ICD-10-CM | POA: Diagnosis not present

## 2022-03-19 DIAGNOSIS — L814 Other melanin hyperpigmentation: Secondary | ICD-10-CM | POA: Diagnosis not present

## 2022-03-19 DIAGNOSIS — L821 Other seborrheic keratosis: Secondary | ICD-10-CM | POA: Diagnosis not present

## 2022-03-25 ENCOUNTER — Other Ambulatory Visit (HOSPITAL_COMMUNITY): Payer: Self-pay

## 2022-03-25 MED ORDER — KETOCONAZOLE 2 % EX SHAM
1.0000 "application " | MEDICATED_SHAMPOO | CUTANEOUS | 99 refills | Status: AC
  Filled 2022-03-25: qty 120, 30d supply, fill #0
  Filled 2022-06-27: qty 120, 30d supply, fill #1

## 2022-03-25 MED ORDER — FLUCONAZOLE 200 MG PO TABS
200.0000 mg | ORAL_TABLET | ORAL | 0 refills | Status: DC
  Filled 2022-03-25: qty 7, 14d supply, fill #0

## 2022-03-28 ENCOUNTER — Encounter: Payer: Self-pay | Admitting: Gastroenterology

## 2022-04-09 ENCOUNTER — Other Ambulatory Visit (HOSPITAL_COMMUNITY): Payer: Self-pay

## 2022-04-10 DIAGNOSIS — Z713 Dietary counseling and surveillance: Secondary | ICD-10-CM | POA: Diagnosis not present

## 2022-04-10 DIAGNOSIS — E663 Overweight: Secondary | ICD-10-CM | POA: Diagnosis not present

## 2022-04-14 ENCOUNTER — Other Ambulatory Visit (HOSPITAL_COMMUNITY): Payer: Self-pay

## 2022-04-18 ENCOUNTER — Other Ambulatory Visit (HOSPITAL_COMMUNITY): Payer: Self-pay

## 2022-04-23 ENCOUNTER — Other Ambulatory Visit (HOSPITAL_COMMUNITY): Payer: Self-pay

## 2022-04-23 MED ORDER — CLONAZEPAM 0.5 MG PO TABS
0.5000 mg | ORAL_TABLET | Freq: Every evening | ORAL | 5 refills | Status: DC | PRN
Start: 2022-04-23 — End: 2022-04-24
  Filled 2022-04-23: qty 60, 30d supply, fill #0

## 2022-04-24 ENCOUNTER — Other Ambulatory Visit (HOSPITAL_COMMUNITY): Payer: Self-pay

## 2022-04-24 ENCOUNTER — Ambulatory Visit (AMBULATORY_SURGERY_CENTER): Payer: Self-pay | Admitting: *Deleted

## 2022-04-24 VITALS — Ht 62.0 in | Wt 150.0 lb

## 2022-04-24 DIAGNOSIS — Z86010 Personal history of colonic polyps: Secondary | ICD-10-CM

## 2022-04-24 MED ORDER — CLONAZEPAM 0.5 MG PO TABS
1.0000 mg | ORAL_TABLET | Freq: Every evening | ORAL | 2 refills | Status: DC
  Filled 2022-04-24: qty 60, 30d supply, fill #0

## 2022-04-24 MED ORDER — CLONAZEPAM 0.5 MG PO TABS
1.0000 mg | ORAL_TABLET | Freq: Every evening | ORAL | 5 refills | Status: DC
Start: 2022-04-24 — End: 2022-10-31
  Filled 2022-04-24 – 2022-05-30 (×2): qty 60, 30d supply, fill #0
  Filled 2022-06-27: qty 60, 30d supply, fill #1
  Filled 2022-07-29: qty 60, 30d supply, fill #2
  Filled 2022-08-24 – 2022-08-28 (×2): qty 60, 30d supply, fill #3
  Filled 2022-09-29: qty 60, 30d supply, fill #4

## 2022-04-24 NOTE — Progress Notes (Signed)
Patient is here in-person for PV. Patient denies any allergies to eggs or soy. Patient denies any problems with anesthesia/sedation. PONV. Patient is not on any oxygen at home. Patient takes diet pill-she is aware to stop 10 days prior to colonoscopy. No blood thinners. Went over procedure prep instructions with the patient. Patient is aware of our care-partner policy. Pt has suprep at home. EMMI education assigned to the patient for the procedure, sent to [facility].

## 2022-05-06 ENCOUNTER — Other Ambulatory Visit (HOSPITAL_COMMUNITY): Payer: Self-pay

## 2022-05-07 ENCOUNTER — Encounter: Payer: Self-pay | Admitting: *Deleted

## 2022-05-08 DIAGNOSIS — Z6827 Body mass index (BMI) 27.0-27.9, adult: Secondary | ICD-10-CM | POA: Diagnosis not present

## 2022-05-08 DIAGNOSIS — Z713 Dietary counseling and surveillance: Secondary | ICD-10-CM | POA: Diagnosis not present

## 2022-05-08 DIAGNOSIS — E663 Overweight: Secondary | ICD-10-CM | POA: Diagnosis not present

## 2022-05-09 DIAGNOSIS — G4700 Insomnia, unspecified: Secondary | ICD-10-CM | POA: Diagnosis not present

## 2022-05-09 DIAGNOSIS — R5383 Other fatigue: Secondary | ICD-10-CM | POA: Diagnosis not present

## 2022-05-09 DIAGNOSIS — G4709 Other insomnia: Secondary | ICD-10-CM | POA: Diagnosis not present

## 2022-05-09 DIAGNOSIS — Z79899 Other long term (current) drug therapy: Secondary | ICD-10-CM | POA: Diagnosis not present

## 2022-05-09 DIAGNOSIS — G43909 Migraine, unspecified, not intractable, without status migrainosus: Secondary | ICD-10-CM | POA: Diagnosis not present

## 2022-05-09 DIAGNOSIS — G4761 Periodic limb movement disorder: Secondary | ICD-10-CM | POA: Diagnosis not present

## 2022-05-15 ENCOUNTER — Encounter: Payer: Self-pay | Admitting: Gastroenterology

## 2022-05-20 ENCOUNTER — Encounter: Payer: Self-pay | Admitting: Gastroenterology

## 2022-05-20 ENCOUNTER — Ambulatory Visit: Payer: 59 | Admitting: Gastroenterology

## 2022-05-20 ENCOUNTER — Other Ambulatory Visit (HOSPITAL_COMMUNITY): Payer: Self-pay

## 2022-05-20 VITALS — BP 104/56 | HR 91 | Temp 99.6°F | Ht 62.0 in | Wt 150.0 lb

## 2022-05-20 DIAGNOSIS — E663 Overweight: Secondary | ICD-10-CM | POA: Diagnosis not present

## 2022-05-20 DIAGNOSIS — Z86010 Personal history of colonic polyps: Secondary | ICD-10-CM

## 2022-05-20 DIAGNOSIS — Z713 Dietary counseling and surveillance: Secondary | ICD-10-CM | POA: Diagnosis not present

## 2022-05-20 DIAGNOSIS — E785 Hyperlipidemia, unspecified: Secondary | ICD-10-CM | POA: Diagnosis not present

## 2022-05-20 MED ORDER — FLEET ENEMA 7-19 GM/118ML RE ENEM
1.0000 | ENEMA | Freq: Once | RECTAL | Status: AC
  Administered 2022-05-20: 1 via RECTAL

## 2022-05-20 MED ORDER — LOMAIRA 8 MG PO TABS
0.5000 | ORAL_TABLET | Freq: Every day | ORAL | 1 refills | Status: AC
  Filled 2022-05-20: qty 30, 30d supply, fill #0
  Filled 2022-06-27: qty 30, 30d supply, fill #1

## 2022-05-20 MED ORDER — SODIUM CHLORIDE 0.9 % IV SOLN: 500.0000 mL | Freq: Once | INTRAVENOUS | Status: DC

## 2022-05-20 NOTE — Progress Notes (Signed)
Vital signs checked by:Magee  The medical and surgical history was reviewed and verified with the patient.  Pt reports a fear of not being completely cleaned out.  To err on side of caution Dr. Fuller Plan gave verbal order to provide an enema.  Patient in agreement.

## 2022-05-20 NOTE — Progress Notes (Signed)
After visualization of pt stool post enema it has been decided that the patient will return home and continue to prep today for a procedure on Dr. Lynne Leader schedule on 8/2 @ 9:00 am.  Patient and Dr. Are in agreement with this plan.

## 2022-05-21 ENCOUNTER — Encounter: Payer: Self-pay | Admitting: Gastroenterology

## 2022-05-21 ENCOUNTER — Ambulatory Visit (AMBULATORY_SURGERY_CENTER): Payer: 59 | Admitting: Gastroenterology

## 2022-05-21 VITALS — BP 108/72 | HR 69 | Temp 98.0°F | Resp 17 | Ht 62.0 in | Wt 150.0 lb

## 2022-05-21 DIAGNOSIS — F419 Anxiety disorder, unspecified: Secondary | ICD-10-CM | POA: Diagnosis not present

## 2022-05-21 DIAGNOSIS — Z09 Encounter for follow-up examination after completed treatment for conditions other than malignant neoplasm: Secondary | ICD-10-CM

## 2022-05-21 DIAGNOSIS — Z86010 Personal history of colonic polyps: Secondary | ICD-10-CM | POA: Diagnosis not present

## 2022-05-21 DIAGNOSIS — Z860101 Personal history of adenomatous and serrated colon polyps: Secondary | ICD-10-CM

## 2022-05-21 DIAGNOSIS — Z1211 Encounter for screening for malignant neoplasm of colon: Secondary | ICD-10-CM | POA: Diagnosis not present

## 2022-05-21 DIAGNOSIS — F32A Depression, unspecified: Secondary | ICD-10-CM | POA: Diagnosis not present

## 2022-05-21 MED ORDER — SODIUM CHLORIDE 0.9 % IV SOLN: 500.0000 mL | Freq: Once | INTRAVENOUS | Status: DC

## 2022-05-21 NOTE — Op Note (Signed)
[facility] Patient Name: Megan Martin Procedure Date: 05/21/2022 9:09 AM MRN: [id number] Endoscopist: Ladene Artist , MD Age: 61 Referring MD:  Date of Birth: [birth year] Gender: Female Account #: [id number] Procedure:                Colonoscopy Indications:              Surveillance: Personal history of adenomatous                            polyps on last colonoscopy > 5 years ago Medicines:                Monitored Anesthesia Care Procedure:                Pre-Anesthesia Assessment:                           - Prior to the procedure, a History and Physical                            was performed, and patient medications and                            allergies were reviewed. The patient's tolerance of                            previous anesthesia was also reviewed. The risks                            and benefits of the procedure and the sedation                            options and risks were discussed with the patient.                            All questions were answered, and informed consent                            was obtained. Prior Anticoagulants: The patient has                            taken no previous anticoagulant or antiplatelet                            agents. ASA Grade Assessment: II - A patient with                            mild systemic disease. After reviewing the risks                            and benefits, the patient was deemed in                            satisfactory condition to undergo the procedure.  After obtaining informed consent, the colonoscope                            was passed under direct vision. Throughout the                            procedure, the patient's blood pressure, pulse, and                            oxygen saturations were monitored continuously. The                            Olympus PCF-[id number]) Colonoscope was                            introduced through the anus  and advanced to the the                            cecum, identified by appendiceal orifice and                            ileocecal valve. The ileocecal valve, appendiceal                            orifice, and rectum were photographed. The quality                            of the bowel preparation was adequate after                            substantial lavage, suction. The colonoscopy was                            performed without difficulty. The patient tolerated                            the procedure well. Scope In: 9:16:08 AM Scope Out: 9:32:32 AM Scope Withdrawal Time: 0 hours 12 minutes 50 seconds  Total Procedure Duration: 0 hours 16 minutes 24 seconds  Findings:                 The perianal and digital rectal examinations were                            normal.                           Internal hemorrhoids were found during                            retroflexion. The hemorrhoids were small and Grade                            I (internal hemorrhoids that do not prolapse).  The exam was otherwise without abnormality on                            direct and retroflexion views. Complications:            No immediate complications. Estimated blood loss:                            None. Estimated Blood Loss:     Estimated blood loss: none. Impression:               - Internal hemorrhoids.                           - The examination was otherwise normal on direct                            and retroflexion views.                           - No specimens collected. Recommendation:           - Repeat colonoscopy in 10 years for surveillance                            with an extended bowel prep.                           - Patient has a contact number available for                            emergencies. The signs and symptoms of potential                            delayed complications were discussed with the                            patient.  Return to normal activities tomorrow.                            Written discharge instructions were provided to the                            patient.                           - Resume previous diet.                           - Continue present medications. Ladene Artist, MD 05/21/2022 9:38:21 AM This report has been signed electronically.

## 2022-05-21 NOTE — Progress Notes (Signed)
History & Physical  Primary Care Physician:  Ginger Organ., MD Primary Gastroenterologist: Lucio Edward, MD  CHIEF COMPLAINT:  Personal history of colon polyps   HPI: Megan Martin is a 61 y.o. female with a personal history of adenomatous colon polyps for surveillance colonoscopy.   Past Medical History:  Diagnosis Date   Anemia    Complication of anesthesia    Pt states she gets post-op N&V,dehydration,decreased B/P past sedation.She denies allergy to soy and eggs   Depression    Gallstones    Hyperlipidemia    Insomnia    Migraine    S/P partial hysterectomy    UTI (lower urinary tract infection)     Past Surgical History:  Procedure Laterality Date   ABDOMINAL HYSTERECTOMY     BLEPHAROPLASTY     bil lower   CESAREAN SECTION  1982, 1984   COLONOSCOPY  05/01/2015   LAPAROSCOPIC HYSTERECTOMY     still has cervix, ovaries, 1 tube   MYOMECTOMY     POLYPECTOMY     sinus irrigations     two times /1968   TONSILLECTOMY     WISDOM TOOTH EXTRACTION      Prior to Admission medications   Medication Sig Start Date End Date Taking? Authorizing Provider  clonazePAM (KLONOPIN) 0.5 MG tablet Take 2 tablets (1 mg total) by mouth at bedtime. 04/24/22  Yes   ondansetron (ZOFRAN ODT) 8 MG disintegrating tablet Take 1 tablet (8 mg total) by mouth every 8 (eight) hours as needed for nausea or vomiting. 03/29/21  Yes Teague Carlis Abbott, Collene Leyden, PA-C  rOPINIRole (REQUIP) 0.5 MG tablet Take 2 tablets (1 mg total) by mouth at bedtime. 09/11/21  Yes Ginger Organ., MD  venlafaxine XR (EFFEXOR XR) 37.5 MG 24 hr capsule Take 3 capsules (112.5 mg total) by mouth daily with breakfast. 09/20/21 06/14/22 Yes Teague Bobbye Morton, PA-C  cyclobenzaprine (FLEXERIL) 10 MG tablet Take 1 tablet (10 mg total) by mouth 3 (three) times daily as needed for muscle spasms. 09/20/21   Teague Carlis Abbott, Collene Leyden, PA-C  Erenumab-aooe 140 MG/ML SOAJ Inject 140 mg into the skin every 30 (thirty) days. 09/20/21    Jaclyn Prime, Collene Leyden, PA-C  ketoconazole (NIZORAL) 2 % shampoo Apply 1 application topically to affected area once a week for 4 weeks 03/19/22   Dunzweiler, Vertell Limber, NP  Phentermine HCl (LOMAIRA) 8 MG TABS Take 0.5-1 tablets by mouth daily around 12-2pm 05/20/22     SUMAtriptan (IMITREX) 100 MG tablet TAKE 1 TABLET (100 MG TOTAL) BY MOUTH ONCE AS NEEDED FOR UP TO 1 DOSE FOR MIGRAINE. MAY REPEAT IN 2 HOURS IF HEADACHE PERSISTS OR RECURS. 09/20/21 09/20/22  Paticia Stack, PA-C    Current Outpatient Medications  Medication Sig Dispense Refill   clonazePAM (KLONOPIN) 0.5 MG tablet Take 2 tablets (1 mg total) by mouth at bedtime. 60 tablet 5   ondansetron (ZOFRAN ODT) 8 MG disintegrating tablet Take 1 tablet (8 mg total) by mouth every 8 (eight) hours as needed for nausea or vomiting. 20 tablet 1   rOPINIRole (REQUIP) 0.5 MG tablet Take 2 tablets (1 mg total) by mouth at bedtime. 30 tablet 11   venlafaxine XR (EFFEXOR XR) 37.5 MG 24 hr capsule Take 3 capsules (112.5 mg total) by mouth daily with breakfast. 270 capsule 3   cyclobenzaprine (FLEXERIL) 10 MG tablet Take 1 tablet (10 mg total) by mouth 3 (three) times daily as needed for muscle spasms.  40 tablet 3   Erenumab-aooe 140 MG/ML SOAJ Inject 140 mg into the skin every 30 (thirty) days. 1 mL 11   ketoconazole (NIZORAL) 2 % shampoo Apply 1 application topically to affected area once a week for 4 weeks 120 mL PRN   Phentermine HCl (LOMAIRA) 8 MG TABS Take 0.5-1 tablets by mouth daily around 12-2pm 30 tablet 1   SUMAtriptan (IMITREX) 100 MG tablet TAKE 1 TABLET (100 MG TOTAL) BY MOUTH ONCE AS NEEDED FOR UP TO 1 DOSE FOR MIGRAINE. MAY REPEAT IN 2 HOURS IF HEADACHE PERSISTS OR RECURS. 9 tablet 3   Current Facility-Administered Medications  Medication Dose Route Frequency Provider Last Rate Last Admin   0.9 %  sodium chloride infusion  500 mL Intravenous Once Ladene Artist, MD        Allergies as of 05/21/2022 - Review Complete 05/21/2022   Allergen Reaction Noted   Demerol Other (See Comments) 01/12/2011   Reglan [metoclopramide]     Topiramate Other (See Comments) 10/15/2021    Family History  Problem Relation Age of Onset   Heart disease Mother    Hypertension Mother    Heart disease Father    Early death Father    Colon polyps Father    Hypertension Brother    Hyperlipidemia Brother    Heart disease Brother    Colon polyps Brother    Colon cancer Paternal Uncle    Colon cancer Cousin    Esophageal cancer Neg Hx    Rectal cancer Neg Hx    Stomach cancer Neg Hx     Social History   Socioeconomic History   Marital status: Married    Spouse name: Not on file   Number of children: Not on file   Years of education: Not on file   Highest education level: Not on file  Occupational History   Not on file  Tobacco Use   Smoking status: Never   Smokeless tobacco: Never  Vaping Use   Vaping Use: Never used  Substance and Sexual Activity   Alcohol use: No   Drug use: No   Sexual activity: Not Currently    Birth control/protection: Surgical  Other Topics Concern   Not on file  Social History Narrative   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 Is active, ambulatory without cane or walker. Divorced, has two children.    Social Determinants of Health   Financial Resource Strain: Not on file  Food Insecurity: Not on file  Transportation Needs: Not on file  Physical Activity: Not on file  Stress: Not on file  Social Connections: Not on file  Intimate Partner Violence: Not on file    Review of Systems:  All systems reviewed were negative except where noted in HPI.   Physical Exam: General:  Alert, well-developed, in NAD Head:  Normocephalic and atraumatic. Eyes:  Sclera clear, no icterus.   Conjunctiva pink. Ears:  Normal auditory acuity. Mouth:  No deformity or lesions.  Neck:  Supple; no masses . Lungs:  Clear throughout to auscultation.   No wheezes, crackles, or  rhonchi. No acute distress. Heart:  Regular rate and rhythm; no murmurs. Abdomen:  Soft, nondistended, nontender. No masses, hepatomegaly. No obvious masses.  Normal bowel .    Rectal:  Deferred   Msk:  Symmetrical without gross deformities.. Pulses:  Normal pulses noted. Extremities:  Without edema. Neurologic:  Alert and  oriented x4;  grossly normal neurologically. Skin:  Intact without significant  lesions or rashes. Cervical Nodes:  No significant cervical adenopathy. Psych:  Alert and cooperative. Normal mood and affect.   Impression / Plan:   Personal history of adenomatous colon polyps for surveillance colonoscopy.  Pricilla Riffle. Fuller Plan  05/21/2022, 9:09 AM See Shea Evans, [facility], to contact our on call provider

## 2022-05-21 NOTE — Patient Instructions (Signed)
   Handout on hemorrhoids given to you today  Resume previous diet & medications    YOU HAD AN ENDOSCOPIC PROCEDURE TODAY AT [facility]:   Refer to the procedure report that was given to you for any specific questions about what was found during the examination.  If the procedure report does not answer your questions, please call your gastroenterologist to clarify.  If you requested that your care partner not be given the details of your procedure findings, then the procedure report has been included in a sealed envelope for you to review at your convenience later.  YOU SHOULD EXPECT: Some feelings of bloating in the abdomen. Passage of more gas than usual.  Walking can help get rid of the air that was put into your GI tract during the procedure and reduce the bloating. If you had a lower endoscopy (such as a colonoscopy or flexible sigmoidoscopy) you may notice spotting of blood in your stool or on the toilet paper. If you underwent a bowel prep for your procedure, you may not have a normal bowel movement for a few days.  Please Note:  You might notice some irritation and congestion in your nose or some drainage.  This is from the oxygen used during your procedure.  There is no need for concern and it should clear up in a day or so.  SYMPTOMS TO REPORT IMMEDIATELY:  Following lower endoscopy (colonoscopy or flexible sigmoidoscopy):  Excessive amounts of blood in the stool  Significant tenderness or worsening of abdominal pains  Swelling of the abdomen that is new, acute  Fever of 100F or higher   For urgent or emergent issues, a gastroenterologist can be reached at any hour by calling [phone]. Do not use MyChart messaging for urgent concerns.    DIET:  We do recommend a small meal at first, but then you may proceed to your regular diet.  Drink plenty of fluids but you should avoid alcoholic beverages for 24 hours.  ACTIVITY:  You should plan to take it easy for  the rest of today and you should NOT DRIVE or use heavy machinery until tomorrow (because of the sedation medicines used during the test).    FOLLOW UP: Our staff will call the number listed on your records the next business day following your procedure.  We will call around 7:15- 8:00 am to check on you and address any questions or concerns that you may have regarding the information given to you following your procedure. If we do not reach you, we will leave a message.  If you develop any symptoms (ie: fever, flu-like symptoms, shortness of breath, cough etc.) before then, please call [phone].  If you test positive for Covid 19 in the 2 weeks post procedure, please call and report this information to [place].    If any biopsies were taken you will be contacted by phone or by letter within the next 1-3 weeks.  Please call us at [phone] if you have not heard about the biopsies in 3 weeks.    SIGNATURES/CONFIDENTIALITY: You and/or your care partner have signed paperwork which will be entered into your electronic medical record.  These signatures attest to the fact that that the information above on your After Visit Summary has been reviewed and is understood.  Full responsibility of the confidentiality of this discharge information lies with you and/or your care-partner.

## 2022-05-21 NOTE — Progress Notes (Signed)
Report given to PACU, vss 

## 2022-05-22 ENCOUNTER — Telehealth: Payer: Self-pay | Admitting: *Deleted

## 2022-05-22 NOTE — Telephone Encounter (Signed)
  Follow up Call-     05/21/2022    8:46 AM  Call back number  Post procedure Call Back phone  # [phone]  Permission to leave phone message Yes     Patient questions:  Do you have a fever, pain , or abdominal swelling? No. Pain Score  0 *  Have you tolerated food without any problems? Yes.    Have you been able to return to your normal activities? Yes.    Do you have any questions about your discharge instructions: Diet   No. Medications  No. Follow up visit  No.  Do you have questions or concerns about your Care? No.  Actions: * If pain score is 4 or above: No action needed, pain <4.

## 2022-05-27 ENCOUNTER — Other Ambulatory Visit (HOSPITAL_COMMUNITY): Payer: Self-pay

## 2022-05-30 ENCOUNTER — Other Ambulatory Visit (HOSPITAL_COMMUNITY): Payer: Self-pay

## 2022-06-02 ENCOUNTER — Other Ambulatory Visit (HOSPITAL_COMMUNITY): Payer: Self-pay

## 2022-06-12 ENCOUNTER — Other Ambulatory Visit (HOSPITAL_COMMUNITY): Payer: Self-pay

## 2022-06-13 ENCOUNTER — Other Ambulatory Visit (HOSPITAL_COMMUNITY): Payer: Self-pay

## 2022-06-27 ENCOUNTER — Other Ambulatory Visit (HOSPITAL_COMMUNITY): Payer: Self-pay

## 2022-07-29 ENCOUNTER — Other Ambulatory Visit (HOSPITAL_COMMUNITY): Payer: Self-pay

## 2022-07-30 ENCOUNTER — Other Ambulatory Visit (HOSPITAL_COMMUNITY): Payer: Self-pay

## 2022-08-05 ENCOUNTER — Other Ambulatory Visit (HOSPITAL_COMMUNITY): Payer: Self-pay

## 2022-08-08 ENCOUNTER — Other Ambulatory Visit (HOSPITAL_COMMUNITY): Payer: Self-pay

## 2022-08-13 ENCOUNTER — Other Ambulatory Visit (HOSPITAL_COMMUNITY): Payer: Self-pay

## 2022-08-24 ENCOUNTER — Other Ambulatory Visit (HOSPITAL_COMMUNITY): Payer: Self-pay

## 2022-08-24 ENCOUNTER — Other Ambulatory Visit: Payer: Self-pay | Admitting: Physician Assistant

## 2022-08-25 ENCOUNTER — Other Ambulatory Visit (HOSPITAL_COMMUNITY): Payer: Self-pay

## 2022-08-27 DIAGNOSIS — Z713 Dietary counseling and surveillance: Secondary | ICD-10-CM | POA: Diagnosis not present

## 2022-08-27 DIAGNOSIS — E663 Overweight: Secondary | ICD-10-CM | POA: Diagnosis not present

## 2022-08-29 ENCOUNTER — Other Ambulatory Visit (HOSPITAL_COMMUNITY): Payer: Self-pay

## 2022-09-01 ENCOUNTER — Other Ambulatory Visit (HOSPITAL_COMMUNITY): Payer: Self-pay

## 2022-09-05 ENCOUNTER — Other Ambulatory Visit (HOSPITAL_COMMUNITY): Payer: Self-pay

## 2022-09-05 MED ORDER — VENLAFAXINE HCL ER 37.5 MG PO CP24
112.5000 mg | ORAL_CAPSULE | Freq: Every day | ORAL | 0 refills | Status: DC
  Filled 2022-09-05: qty 90, 30d supply, fill #0

## 2022-09-05 MED ORDER — ONDANSETRON 8 MG PO TBDP
8.0000 mg | ORAL_TABLET | Freq: Three times a day (TID) | ORAL | 1 refills | Status: DC | PRN
  Filled 2022-09-05: qty 20, 7d supply, fill #0

## 2022-09-19 ENCOUNTER — Other Ambulatory Visit (HOSPITAL_COMMUNITY): Payer: Self-pay

## 2022-09-19 ENCOUNTER — Encounter: Payer: Self-pay | Admitting: Physician Assistant

## 2022-09-19 ENCOUNTER — Ambulatory Visit (INDEPENDENT_AMBULATORY_CARE_PROVIDER_SITE_OTHER): Payer: 59 | Admitting: Physician Assistant

## 2022-09-19 VITALS — BP 94/63 | HR 86 | Wt 138.0 lb

## 2022-09-19 DIAGNOSIS — G2581 Restless legs syndrome: Secondary | ICD-10-CM | POA: Diagnosis not present

## 2022-09-19 DIAGNOSIS — G43009 Migraine without aura, not intractable, without status migrainosus: Secondary | ICD-10-CM | POA: Diagnosis not present

## 2022-09-19 MED ORDER — CYCLOBENZAPRINE HCL 10 MG PO TABS
10.0000 mg | ORAL_TABLET | Freq: Three times a day (TID) | ORAL | 3 refills | Status: AC | PRN
  Filled 2022-09-19: qty 40, 14d supply, fill #0
  Filled 2022-10-31: qty 40, 14d supply, fill #1
  Filled 2023-03-20: qty 40, 14d supply, fill #2

## 2022-09-19 MED ORDER — ERENUMAB-AOOE 140 MG/ML ~~LOC~~ SOAJ
140.0000 mg | SUBCUTANEOUS | 11 refills | Status: DC
  Filled 2022-09-19: qty 1, 30d supply, fill #0
  Filled 2022-10-31 – 2022-11-10 (×2): qty 1, 30d supply, fill #1
  Filled 2022-12-26: qty 1, 30d supply, fill #2
  Filled 2023-01-28: qty 1, 30d supply, fill #3
  Filled 2023-02-25: qty 1, 30d supply, fill #4
  Filled 2023-03-19: qty 1, 30d supply, fill #5
  Filled 2023-04-25: qty 1, 30d supply, fill #6
  Filled 2023-05-22: qty 1, 30d supply, fill #7
  Filled 2023-06-21 – 2023-07-02 (×2): qty 1, 30d supply, fill #8
  Filled 2023-08-02: qty 1, 30d supply, fill #9
  Filled 2023-09-01: qty 1, 30d supply, fill #10

## 2022-09-19 MED ORDER — VENLAFAXINE HCL ER 75 MG PO CP24
75.0000 mg | ORAL_CAPSULE | Freq: Two times a day (BID) | ORAL | 3 refills | Status: DC
  Filled 2022-09-19: qty 180, 90d supply, fill #0
  Filled 2022-12-02 – 2022-12-04 (×2): qty 180, 90d supply, fill #1
  Filled 2023-03-09: qty 180, 90d supply, fill #2
  Filled 2023-06-08: qty 180, 90d supply, fill #3

## 2022-09-19 MED ORDER — ROPINIROLE HCL 0.5 MG PO TABS
1.0000 mg | ORAL_TABLET | Freq: Every evening | ORAL | 11 refills | Status: AC
  Filled 2022-09-19: qty 60, 30d supply, fill #0
  Filled 2022-10-15 – 2022-11-21 (×2): qty 60, 30d supply, fill #1
  Filled 2023-01-06: qty 60, 30d supply, fill #2
  Filled 2023-03-17: qty 60, 30d supply, fill #3
  Filled 2023-06-18: qty 60, 30d supply, fill #4
  Filled 2023-07-19: qty 60, 30d supply, fill #5

## 2022-09-19 MED ORDER — ONDANSETRON 8 MG PO TBDP
8.0000 mg | ORAL_TABLET | Freq: Three times a day (TID) | ORAL | 1 refills | Status: DC | PRN
  Filled 2022-09-19: qty 20, 7d supply, fill #0
  Filled 2023-01-06: qty 20, 7d supply, fill #1

## 2022-09-19 MED ORDER — SUMATRIPTAN SUCCINATE 100 MG PO TABS
100.0000 mg | ORAL_TABLET | ORAL | 3 refills | Status: AC | PRN
  Filled 2022-09-19: qty 9, 30d supply, fill #0
  Filled 2023-01-06: qty 9, 30d supply, fill #1

## 2022-09-19 NOTE — Progress Notes (Signed)
Pt states she is doing well and has no concerns today.  History:  Megan Martin is a 61 y.o. who presents to clinic today for annual eval of migraine.  She has overall been doing well and she is not interested in making changes to her regimen.  In fact, she saw a neurologist for insomnia that recommended she look at venlafaxine as a culprit for the insomnia.  She was uninterested to make that change for fear it would upset the balance that is working for her headaches.  The quality of headaches is quite unchanged and she notes the severity is such that she often does not need medication to treat.   Pt has completed the adoptions of all 5 kids that were in foster care at their home.    Number of days in the last 4 weeks with:  Severe headache: 0 Moderate headache: 1 Mild headache: 6  No headache: 21   Past Medical History:  Diagnosis Date   Anemia    Complication of anesthesia    Pt states she gets post-op N&V,dehydration,decreased B/P past sedation.She denies allergy to soy and eggs   Depression    Gallstones    Hyperlipidemia    Insomnia    Migraine    S/P partial hysterectomy    UTI (lower urinary tract infection)     Social History   Socioeconomic History   Marital status: Married    Spouse name: Not on file   Number of children: Not on file   Years of education: Not on file   Highest education level: Not on file  Occupational History   Not on file  Tobacco Use   Smoking status: Never   Smokeless tobacco: Never  Vaping Use   Vaping Use: Never used  Substance and Sexual Activity   Alcohol use: No   Drug use: No   Sexual activity: Not Currently    Birth control/protection: Surgical  Other Topics Concern   Not on file  Social History Narrative   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 Is active, ambulatory without cane or walker. Divorced, has two children.    Social Determinants of Health   Financial Resource Strain: Not on file   Food Insecurity: Not on file  Transportation Needs: Not on file  Physical Activity: Not on file  Stress: Not on file  Social Connections: Not on file  Intimate Partner Violence: Not on file    Family History  Problem Relation Age of Onset   Heart disease Mother    Hypertension Mother    Heart disease Father    Early death Father    Colon polyps Father    Hypertension Brother    Hyperlipidemia Brother    Heart disease Brother    Colon polyps Brother    Colon cancer Paternal Uncle    Colon cancer Cousin    Esophageal cancer Neg Hx    Rectal cancer Neg Hx    Stomach cancer Neg Hx     Allergies  Allergen Reactions   Demerol Other (See Comments)    Swelling of arm   Reglan [Metoclopramide]     Agitation and anxiousness   Topiramate Other (See Comments)    Brain fog    Current Outpatient Medications on File Prior to Visit  Medication Sig Dispense Refill   clonazePAM (KLONOPIN) 0.5 MG tablet Take 2 tablets (1 mg total) by mouth at bedtime. 60 tablet 5   cyclobenzaprine (FLEXERIL) 10 MG  tablet Take 1 tablet (10 mg total) by mouth 3 (three) times daily as needed for muscle spasms. 40 tablet 3   Erenumab-aooe 140 MG/ML SOAJ Inject 140 mg into the skin every 30 (thirty) days. 1 mL 11   ketoconazole (NIZORAL) 2 % shampoo Apply 1 application topically to affected area once a week for 4 weeks 120 mL PRN   ondansetron (ZOFRAN-ODT) 8 MG disintegrating tablet Take 1 tablet (8 mg total) by mouth every 8 (eight) hours as needed for nausea or vomiting. 20 tablet 1   Phentermine HCl (LOMAIRA) 8 MG TABS Take 0.5-1 tablets by mouth daily around 12-2pm 30 tablet 1   rOPINIRole (REQUIP) 0.5 MG tablet Take 2 tablets (1 mg total) by mouth at bedtime. 30 tablet 11   SUMAtriptan (IMITREX) 100 MG tablet TAKE 1 TABLET (100 MG TOTAL) BY MOUTH ONCE AS NEEDED FOR UP TO 1 DOSE FOR MIGRAINE. MAY REPEAT IN 2 HOURS IF HEADACHE PERSISTS OR RECURS. 9 tablet 3   venlafaxine XR (EFFEXOR XR) 37.5 MG 24 hr  capsule Take 3 capsules (112.5 mg total) by mouth daily with breakfast. 90 capsule 0   No current facility-administered medications on file prior to visit.     Review of Systems:  All pertinent positive/negative included in HPI, all other review of systems are negative   Objective:  Physical Exam BP 94/63   Pulse 86   Wt 138 lb (62.6 kg)   BMI 25.24 kg/m  CONSTITUTIONAL: Well-developed, well-nourished female in no acute distress.  EYES: EOM intact ENT: Normocephalic CARDIOVASCULAR: Regular rate and rhythm with no adventitious sounds.  RESPIRATORY: Normal rate.  MUSCULOSKELETAL: Normal ROM SKIN: Warm, dry without erythema  NEUROLOGICAL: Alert, oriented, CN II-XII grossly intact, Appropriate balance  PSYCH: Normal behavior, mood   Assessment & Plan:  Assessment: 1. Migraine without aura and without status migrainosus, not intractable   2. Restless leg syndrome      Plan: After our discussion, pt interested in trying a higher dose of venlafaxine.  Will increase to '150mg'$  daily.  If pt does not tolerate, she will let us know.  We can decrease back to 112.'5mg'$  if necessary.  Continue other meds as previous.  I did refill acute meds just in case she finds herself needing them.   Follow-up in 12 months or sooner PRN  Megan Stack, PA-C 09/19/2022 9:10 AM

## 2022-09-19 NOTE — Patient Instructions (Signed)
Migraine Headache  A migraine headache is a very strong throbbing pain on one side or both sides of your head. This type of headache can also cause other symptoms. It can last from 4 hours to 3 days. Talk with your doctor about what things may bring on (trigger) this condition.  What are the causes?  The exact cause of this condition is not known. This condition may be triggered or caused by:  Drinking alcohol.  Smoking.  Taking medicines, such as:  Medicine used to treat chest pain (nitroglycerin).  Birth control pills.  Estrogen.  Some blood pressure medicines.  Eating or drinking certain products.  Doing physical activity.  Other things that may trigger a migraine headache include:  Having a menstrual period.  Pregnancy.  Hunger.  Stress.  Not getting enough sleep or getting too much sleep.  Weather changes.  Tiredness (fatigue).  What increases the risk?  Being 25-55 years old.  Being female.  Having a family history of migraine headaches.  Being Caucasian.  Having depression or anxiety.  Being very overweight.  What are the signs or symptoms?  A throbbing pain. This pain may:  Happen in any area of the head, such as on one side or both sides.  Make it hard to do daily activities.  Get worse with physical activity.  Get worse around bright lights or loud noises.  Other symptoms may include:  Feeling sick to your stomach (nauseous).  Vomiting.  Dizziness.  Being sensitive to bright lights, loud noises, or smells.  Before you get a migraine headache, you may get warning signs (an aura). An aura may include:  Seeing flashing lights or having blind spots.  Seeing bright spots, halos, or zigzag lines.  Having tunnel vision or blurred vision.  Having numbness or a tingling feeling.  Having trouble talking.  Having weak muscles.  Some people have symptoms after a migraine headache (postdromal phase), such as:  Tiredness.  Trouble thinking (concentrating).  How is this treated?  Taking medicines that:  Relieve  pain.  Relieve the feeling of being sick to your stomach.  Prevent migraine headaches.  Treatment may also include:  Having acupuncture.  Avoiding foods that bring on migraine headaches.  Learning ways to control your body functions (biofeedback).  Therapy to help you know and deal with negative thoughts (cognitive behavioral therapy).  Follow these instructions at home:  Medicines  Take over-the-counter and prescription medicines only as told by your doctor.  Ask your doctor if the medicine prescribed to you:  Requires you to avoid driving or using heavy machinery.  Can cause trouble pooping (constipation). You may need to take these steps to prevent or treat trouble pooping:  Drink enough fluid to keep your pee (urine) pale yellow.  Take over-the-counter or prescription medicines.  Eat foods that are high in fiber. These include beans, whole grains, and fresh fruits and vegetables.  Limit foods that are high in fat and sugar. These include fried or sweet foods.  Lifestyle  Do not drink alcohol.  Do not use any products that contain nicotine or tobacco, such as cigarettes, e-cigarettes, and chewing tobacco. If you need help quitting, ask your doctor.  Get at least 8 hours of sleep every night.  Limit and deal with stress.  General instructions  Keep a journal to find out what may bring on your migraine headaches. For example, write down:  What you eat and drink.  How much sleep you get.  Any change in   what you eat or drink.  Any change in your medicines.  If you have a migraine headache:  Avoid things that make your symptoms worse, such as bright lights.  It may help to lie down in a dark, quiet room.  Do not drive or use heavy machinery.  Ask your doctor what activities are safe for you.  Keep all follow-up visits as told by your doctor. This is important.  Contact a doctor if:  You get a migraine headache that is different or worse than others you have had.  You have more than 15 headache days in one month.  Get  help right away if:  Your migraine headache gets very bad.  Your migraine headache lasts longer than 72 hours.  You have a fever.  You have a stiff neck.  You have trouble seeing.  Your muscles feel weak or like you cannot control them.  You start to lose your balance a lot.  You start to have trouble walking.  You pass out (faint).  You have a seizure.  Summary  A migraine headache is a very strong throbbing pain on one side or both sides of your head. These headaches can also cause other symptoms.  This condition may be treated with medicines and changes to your lifestyle.  Keep a journal to find out what may bring on your migraine headaches.  Contact a doctor if you get a migraine headache that is different or worse than others you have had.  Contact your doctor if you have more than 15 headache days in a month.  This information is not intended to replace advice given to you by your health care provider. Make sure you discuss any questions you have with your health care provider.  Document Revised: 03/20/2022 Document Reviewed: 11/18/2018  [employer] Patient Education  2023 [employer]

## 2022-09-22 ENCOUNTER — Other Ambulatory Visit (HOSPITAL_COMMUNITY): Payer: Self-pay

## 2022-09-22 DIAGNOSIS — E785 Hyperlipidemia, unspecified: Secondary | ICD-10-CM | POA: Diagnosis not present

## 2022-09-22 DIAGNOSIS — E663 Overweight: Secondary | ICD-10-CM | POA: Diagnosis not present

## 2022-09-22 DIAGNOSIS — K5909 Other constipation: Secondary | ICD-10-CM | POA: Diagnosis not present

## 2022-09-22 MED ORDER — LINZESS 72 MCG PO CAPS
72.0000 ug | ORAL_CAPSULE | Freq: Every day | ORAL | 1 refills | Status: AC
  Filled 2022-09-22: qty 30, 30d supply, fill #0
  Filled 2022-10-31 – 2023-01-28 (×5): qty 30, 30d supply, fill #1

## 2022-09-30 ENCOUNTER — Other Ambulatory Visit: Payer: Self-pay

## 2022-10-15 ENCOUNTER — Other Ambulatory Visit (HOSPITAL_COMMUNITY): Payer: Self-pay

## 2022-10-16 ENCOUNTER — Other Ambulatory Visit: Payer: Self-pay

## 2022-10-31 ENCOUNTER — Other Ambulatory Visit (HOSPITAL_COMMUNITY): Payer: Self-pay

## 2022-10-31 MED ORDER — CLONAZEPAM 0.5 MG PO TABS
1.0000 mg | ORAL_TABLET | Freq: Every evening | ORAL | 2 refills | Status: DC
  Filled 2022-10-31: qty 60, 30d supply, fill #0
  Filled 2022-12-02 – 2022-12-04 (×2): qty 60, 30d supply, fill #1
  Filled 2023-01-06: qty 60, 30d supply, fill #2

## 2022-11-02 ENCOUNTER — Other Ambulatory Visit (HOSPITAL_COMMUNITY): Payer: Self-pay

## 2022-11-10 ENCOUNTER — Other Ambulatory Visit (HOSPITAL_COMMUNITY): Payer: Self-pay

## 2022-11-11 ENCOUNTER — Encounter: Payer: Self-pay | Admitting: *Deleted

## 2022-11-18 ENCOUNTER — Other Ambulatory Visit (HOSPITAL_COMMUNITY): Payer: Self-pay

## 2022-11-21 ENCOUNTER — Other Ambulatory Visit (HOSPITAL_COMMUNITY): Payer: Self-pay

## 2022-11-25 ENCOUNTER — Other Ambulatory Visit (HOSPITAL_COMMUNITY): Payer: Self-pay

## 2022-12-04 ENCOUNTER — Other Ambulatory Visit: Payer: Self-pay

## 2022-12-04 ENCOUNTER — Other Ambulatory Visit (HOSPITAL_COMMUNITY): Payer: Self-pay

## 2022-12-05 ENCOUNTER — Other Ambulatory Visit (HOSPITAL_COMMUNITY): Payer: Self-pay

## 2022-12-12 ENCOUNTER — Other Ambulatory Visit: Payer: Self-pay

## 2022-12-19 ENCOUNTER — Other Ambulatory Visit (HOSPITAL_BASED_OUTPATIENT_CLINIC_OR_DEPARTMENT_OTHER): Payer: Self-pay

## 2023-01-01 ENCOUNTER — Other Ambulatory Visit (HOSPITAL_COMMUNITY): Payer: Self-pay

## 2023-01-07 ENCOUNTER — Other Ambulatory Visit: Payer: Self-pay

## 2023-01-28 ENCOUNTER — Other Ambulatory Visit (HOSPITAL_COMMUNITY): Payer: Self-pay

## 2023-01-28 ENCOUNTER — Other Ambulatory Visit: Payer: Self-pay | Admitting: Physician Assistant

## 2023-01-28 MED ORDER — CLONAZEPAM 0.5 MG PO TABS
1.0000 mg | ORAL_TABLET | Freq: Every day | ORAL | 2 refills | Status: DC
  Filled 2023-01-28 – 2023-02-11 (×3): qty 60, 30d supply, fill #0
  Filled 2023-03-15: qty 60, 30d supply, fill #1
  Filled 2023-04-21: qty 60, 30d supply, fill #2

## 2023-01-29 ENCOUNTER — Other Ambulatory Visit (HOSPITAL_COMMUNITY): Payer: Self-pay

## 2023-01-29 ENCOUNTER — Other Ambulatory Visit: Payer: Self-pay

## 2023-01-30 ENCOUNTER — Other Ambulatory Visit (HOSPITAL_COMMUNITY): Payer: Self-pay

## 2023-01-30 MED ORDER — ONDANSETRON 8 MG PO TBDP
8.0000 mg | ORAL_TABLET | Freq: Three times a day (TID) | ORAL | 1 refills | Status: AC | PRN
  Filled 2023-01-30: qty 20, 7d supply, fill #0
  Filled 2023-03-17: qty 20, 7d supply, fill #1

## 2023-02-11 ENCOUNTER — Other Ambulatory Visit: Payer: Self-pay

## 2023-02-12 ENCOUNTER — Other Ambulatory Visit (HOSPITAL_COMMUNITY): Payer: Self-pay

## 2023-02-23 ENCOUNTER — Other Ambulatory Visit (HOSPITAL_COMMUNITY): Payer: Self-pay

## 2023-02-23 MED ORDER — ESTRADIOL 0.05 MG/24HR TD PTTW
1.0000 | MEDICATED_PATCH | TRANSDERMAL | 3 refills | Status: AC
  Filled 2023-02-23: qty 24, 84d supply, fill #0

## 2023-02-24 ENCOUNTER — Other Ambulatory Visit (HOSPITAL_COMMUNITY): Payer: Self-pay

## 2023-02-25 ENCOUNTER — Other Ambulatory Visit (HOSPITAL_COMMUNITY): Payer: Self-pay

## 2023-02-25 ENCOUNTER — Encounter (HOSPITAL_COMMUNITY): Payer: Self-pay

## 2023-02-25 ENCOUNTER — Other Ambulatory Visit: Payer: Self-pay

## 2023-03-06 ENCOUNTER — Other Ambulatory Visit (HOSPITAL_COMMUNITY): Payer: Self-pay

## 2023-03-16 ENCOUNTER — Other Ambulatory Visit: Payer: Self-pay

## 2023-03-19 ENCOUNTER — Other Ambulatory Visit (HOSPITAL_COMMUNITY): Payer: Self-pay

## 2023-03-20 ENCOUNTER — Other Ambulatory Visit (HOSPITAL_COMMUNITY): Payer: Self-pay

## 2023-03-27 ENCOUNTER — Other Ambulatory Visit (HOSPITAL_COMMUNITY): Payer: Self-pay

## 2023-03-27 MED ORDER — TRETINOIN 0.05 % EX CREA
TOPICAL_CREAM | CUTANEOUS | 1 refills | Status: AC
  Filled 2023-03-27: qty 45, 90d supply, fill #0
  Filled 2023-04-09: qty 45, 30d supply, fill #0
  Filled 2023-06-21 – 2023-09-01 (×3): qty 45, 30d supply, fill #1

## 2023-03-27 MED ORDER — FLUCONAZOLE 200 MG PO TABS
200.0000 mg | ORAL_TABLET | Freq: Every day | ORAL | 0 refills | Status: AC
  Filled 2023-03-27 – 2023-04-09 (×2): qty 7, 14d supply, fill #0

## 2023-03-27 MED ORDER — KETOCONAZOLE 2 % EX SHAM
MEDICATED_SHAMPOO | CUTANEOUS | 11 refills | Status: AC
  Filled 2023-03-27 – 2023-04-09 (×2): qty 120, 30d supply, fill #0

## 2023-03-30 ENCOUNTER — Other Ambulatory Visit (HOSPITAL_COMMUNITY): Payer: Self-pay

## 2023-03-31 ENCOUNTER — Other Ambulatory Visit (HOSPITAL_COMMUNITY): Payer: Self-pay

## 2023-04-08 ENCOUNTER — Other Ambulatory Visit (HOSPITAL_COMMUNITY): Payer: Self-pay

## 2023-04-09 ENCOUNTER — Other Ambulatory Visit (HOSPITAL_COMMUNITY): Payer: Self-pay

## 2023-04-21 ENCOUNTER — Other Ambulatory Visit: Payer: Self-pay

## 2023-05-18 ENCOUNTER — Other Ambulatory Visit (HOSPITAL_COMMUNITY): Payer: Self-pay

## 2023-05-19 ENCOUNTER — Other Ambulatory Visit (HOSPITAL_COMMUNITY): Payer: Self-pay

## 2023-05-19 MED ORDER — CLONAZEPAM 0.5 MG PO TABS
1.0000 mg | ORAL_TABLET | Freq: Every day | ORAL | 2 refills | Status: DC
Start: 2023-05-19 — End: 2023-08-03
  Filled 2023-05-19: qty 60, 30d supply, fill #0
  Filled 2023-06-18: qty 60, 30d supply, fill #1
  Filled 2023-07-19: qty 60, 30d supply, fill #2

## 2023-06-19 ENCOUNTER — Other Ambulatory Visit: Payer: Self-pay

## 2023-06-21 ENCOUNTER — Other Ambulatory Visit (HOSPITAL_COMMUNITY): Payer: Self-pay

## 2023-06-23 ENCOUNTER — Other Ambulatory Visit (HOSPITAL_COMMUNITY): Payer: Self-pay

## 2023-06-23 ENCOUNTER — Other Ambulatory Visit: Payer: Self-pay

## 2023-06-23 MED ORDER — FLUCONAZOLE 200 MG PO TABS
200.0000 mg | ORAL_TABLET | ORAL | 0 refills | Status: AC
  Filled 2023-06-23 – 2023-12-06 (×2): qty 7, 14d supply, fill #0

## 2023-07-02 ENCOUNTER — Other Ambulatory Visit (HOSPITAL_COMMUNITY): Payer: Self-pay

## 2023-07-06 ENCOUNTER — Other Ambulatory Visit (HOSPITAL_COMMUNITY): Payer: Self-pay

## 2023-07-20 ENCOUNTER — Other Ambulatory Visit: Payer: Self-pay

## 2023-08-02 ENCOUNTER — Other Ambulatory Visit (HOSPITAL_COMMUNITY): Payer: Self-pay

## 2023-08-03 ENCOUNTER — Other Ambulatory Visit: Payer: Self-pay

## 2023-08-03 ENCOUNTER — Other Ambulatory Visit (HOSPITAL_COMMUNITY): Payer: Self-pay

## 2023-08-03 MED ORDER — CLONAZEPAM 0.5 MG PO TABS
1.0000 mg | ORAL_TABLET | Freq: Every day | ORAL | 0 refills | Status: DC
Start: 2023-08-03 — End: 2023-09-16
  Filled 2023-08-03 – 2023-08-19 (×2): qty 60, 30d supply, fill #0

## 2023-08-05 ENCOUNTER — Other Ambulatory Visit (HOSPITAL_COMMUNITY): Payer: Self-pay

## 2023-08-06 ENCOUNTER — Other Ambulatory Visit (HOSPITAL_COMMUNITY): Payer: Self-pay

## 2023-08-06 MED ORDER — SODIUM FLUORIDE 0.2 % MT SOLN
OROMUCOSAL | 5 refills | Status: AC
  Filled 2023-08-06: qty 473, 30d supply, fill #0

## 2023-08-20 ENCOUNTER — Other Ambulatory Visit (HOSPITAL_COMMUNITY): Payer: Self-pay

## 2023-09-01 ENCOUNTER — Other Ambulatory Visit: Payer: Self-pay | Admitting: Physician Assistant

## 2023-09-01 ENCOUNTER — Other Ambulatory Visit (HOSPITAL_COMMUNITY): Payer: Self-pay

## 2023-09-01 ENCOUNTER — Other Ambulatory Visit: Payer: Self-pay

## 2023-09-04 ENCOUNTER — Other Ambulatory Visit: Payer: Self-pay

## 2023-09-04 ENCOUNTER — Other Ambulatory Visit (HOSPITAL_COMMUNITY): Payer: Self-pay

## 2023-09-04 MED ORDER — VENLAFAXINE HCL ER 75 MG PO CP24
75.0000 mg | ORAL_CAPSULE | Freq: Two times a day (BID) | ORAL | 0 refills | Status: DC
  Filled 2023-09-04: qty 180, 90d supply, fill #0

## 2023-09-15 ENCOUNTER — Other Ambulatory Visit (HOSPITAL_COMMUNITY): Payer: Self-pay

## 2023-09-16 ENCOUNTER — Encounter (HOSPITAL_COMMUNITY): Payer: Self-pay

## 2023-09-16 ENCOUNTER — Other Ambulatory Visit (HOSPITAL_COMMUNITY): Payer: Self-pay

## 2023-09-16 MED ORDER — CLONAZEPAM 0.5 MG PO TABS
1.0000 mg | ORAL_TABLET | Freq: Every day | ORAL | 3 refills | Status: AC
Start: 2023-09-16 — End: ?
  Filled 2023-09-16: qty 60, 30d supply, fill #0
  Filled 2023-10-22: qty 60, 30d supply, fill #1
  Filled 2023-11-23: qty 60, 30d supply, fill #2
  Filled 2023-12-21: qty 60, 30d supply, fill #3

## 2023-10-15 ENCOUNTER — Other Ambulatory Visit: Payer: Self-pay | Admitting: Physician Assistant

## 2023-10-22 ENCOUNTER — Other Ambulatory Visit: Payer: Self-pay

## 2023-10-23 ENCOUNTER — Other Ambulatory Visit (HOSPITAL_COMMUNITY): Payer: Self-pay

## 2023-11-02 ENCOUNTER — Other Ambulatory Visit: Payer: Self-pay | Admitting: Physician Assistant

## 2023-11-05 ENCOUNTER — Other Ambulatory Visit (HOSPITAL_COMMUNITY): Payer: Self-pay

## 2023-11-05 ENCOUNTER — Encounter (HOSPITAL_COMMUNITY): Payer: Self-pay

## 2023-11-05 MED ORDER — AIMOVIG 140 MG/ML ~~LOC~~ SOAJ
140.0000 mg | SUBCUTANEOUS | 0 refills | Status: DC
  Filled 2023-11-05: qty 1, 30d supply, fill #0

## 2023-11-12 ENCOUNTER — Other Ambulatory Visit (HOSPITAL_COMMUNITY): Payer: Self-pay

## 2023-11-13 ENCOUNTER — Other Ambulatory Visit (HOSPITAL_COMMUNITY): Payer: Self-pay

## 2023-11-13 MED ORDER — SUMATRIPTAN SUCCINATE 100 MG PO TABS
100.0000 mg | ORAL_TABLET | ORAL | 3 refills | Status: AC | PRN
  Filled 2023-11-13: qty 9, 20d supply, fill #0

## 2023-11-13 MED ORDER — CLONAZEPAM 0.5 MG PO TABS
1.0000 mg | ORAL_TABLET | Freq: Every day | ORAL | 3 refills | Status: AC
  Filled 2023-11-13 – 2024-01-25 (×2): qty 60, 30d supply, fill #0
  Filled 2024-02-21: qty 60, 30d supply, fill #1
  Filled 2024-03-21: qty 60, 30d supply, fill #2
  Filled 2024-04-20: qty 60, 30d supply, fill #3

## 2023-11-13 MED ORDER — ROPINIROLE HCL 0.5 MG PO TABS
0.5000 mg | ORAL_TABLET | Freq: Every day | ORAL | 3 refills | Status: DC
  Filled 2023-11-13: qty 90, 90d supply, fill #0
  Filled 2024-02-29: qty 90, 90d supply, fill #1
  Filled 2024-05-27: qty 90, 90d supply, fill #2
  Filled 2024-09-02: qty 90, 90d supply, fill #3

## 2023-11-13 MED ORDER — AIMOVIG 140 MG/ML ~~LOC~~ SOAJ
140.0000 mg | SUBCUTANEOUS | 3 refills | Status: AC
  Filled 2023-11-13 – 2023-12-06 (×2): qty 3, 90d supply, fill #0
  Filled 2023-12-07: qty 1, 30d supply, fill #0
  Filled 2023-12-31: qty 1, 30d supply, fill #1
  Filled 2024-01-28: qty 1, 30d supply, fill #2
  Filled 2024-02-22: qty 1, 30d supply, fill #3
  Filled 2024-03-22: qty 1, 30d supply, fill #4
  Filled 2024-04-20: qty 1, 30d supply, fill #5
  Filled 2024-05-17: qty 1, 30d supply, fill #6
  Filled 2024-06-15: qty 1, 30d supply, fill #7
  Filled 2024-07-15: qty 1, 30d supply, fill #8
  Filled 2024-08-16: qty 1, 30d supply, fill #9
  Filled 2024-09-13: qty 1, 30d supply, fill #10
  Filled 2024-10-25: qty 1, 30d supply, fill #11

## 2023-11-24 ENCOUNTER — Other Ambulatory Visit: Payer: Self-pay

## 2023-11-25 ENCOUNTER — Other Ambulatory Visit: Payer: Self-pay | Admitting: Physician Assistant

## 2023-11-27 ENCOUNTER — Encounter: Payer: Commercial Managed Care - PPO | Admitting: Physician Assistant

## 2023-11-27 ENCOUNTER — Other Ambulatory Visit (HOSPITAL_COMMUNITY): Payer: Self-pay

## 2023-11-27 MED ORDER — VENLAFAXINE HCL ER 75 MG PO CP24
75.0000 mg | ORAL_CAPSULE | Freq: Two times a day (BID) | ORAL | 0 refills | Status: DC
  Filled 2023-11-27: qty 180, 90d supply, fill #0

## 2023-12-07 ENCOUNTER — Other Ambulatory Visit (HOSPITAL_COMMUNITY): Payer: Self-pay

## 2023-12-14 ENCOUNTER — Other Ambulatory Visit (HOSPITAL_COMMUNITY): Payer: Self-pay

## 2023-12-22 ENCOUNTER — Other Ambulatory Visit: Payer: Self-pay

## 2023-12-31 ENCOUNTER — Other Ambulatory Visit (HOSPITAL_COMMUNITY): Payer: Self-pay

## 2024-01-21 ENCOUNTER — Other Ambulatory Visit (HOSPITAL_COMMUNITY): Payer: Self-pay

## 2024-01-22 ENCOUNTER — Encounter: Payer: Self-pay | Admitting: Physician Assistant

## 2024-01-25 ENCOUNTER — Other Ambulatory Visit (HOSPITAL_COMMUNITY): Payer: Self-pay

## 2024-01-25 MED ORDER — CLONAZEPAM 0.5 MG PO TABS
1.0000 mg | ORAL_TABLET | Freq: Every day | ORAL | 0 refills | Status: DC
  Filled 2024-01-25 – 2024-05-17 (×2): qty 60, 30d supply, fill #0

## 2024-01-28 ENCOUNTER — Other Ambulatory Visit (HOSPITAL_COMMUNITY): Payer: Self-pay

## 2024-02-22 ENCOUNTER — Other Ambulatory Visit: Payer: Self-pay

## 2024-02-29 ENCOUNTER — Other Ambulatory Visit: Payer: Self-pay | Admitting: Physician Assistant

## 2024-03-03 ENCOUNTER — Other Ambulatory Visit: Payer: Self-pay | Admitting: Obstetrics and Gynecology

## 2024-03-03 DIAGNOSIS — R928 Other abnormal and inconclusive findings on diagnostic imaging of breast: Secondary | ICD-10-CM

## 2024-03-04 ENCOUNTER — Ambulatory Visit
Admission: RE | Admit: 2024-03-04 | Discharge: 2024-03-04 | Disposition: A | Discharge status: Home / Self Care | Source: Ambulatory Visit | Attending: Obstetrics and Gynecology | Admitting: Obstetrics and Gynecology

## 2024-03-04 ENCOUNTER — Ambulatory Visit
Admission: RE | Admit: 2024-03-04 | Discharge: 2024-03-04 | Disposition: A | Discharge status: Home / Self Care | Payer: Self-pay | Source: Ambulatory Visit | Attending: Obstetrics and Gynecology | Admitting: Obstetrics and Gynecology

## 2024-03-04 DIAGNOSIS — R928 Other abnormal and inconclusive findings on diagnostic imaging of breast: Secondary | ICD-10-CM

## 2024-03-08 ENCOUNTER — Other Ambulatory Visit (HOSPITAL_COMMUNITY): Payer: Self-pay

## 2024-03-18 ENCOUNTER — Other Ambulatory Visit (HOSPITAL_COMMUNITY): Payer: Self-pay

## 2024-03-18 ENCOUNTER — Other Ambulatory Visit: Payer: Self-pay

## 2024-03-22 ENCOUNTER — Other Ambulatory Visit: Payer: Self-pay

## 2024-03-22 ENCOUNTER — Other Ambulatory Visit (HOSPITAL_COMMUNITY): Payer: Self-pay

## 2024-03-23 ENCOUNTER — Other Ambulatory Visit: Payer: Self-pay

## 2024-03-24 ENCOUNTER — Other Ambulatory Visit (HOSPITAL_COMMUNITY): Payer: Self-pay

## 2024-03-24 MED ORDER — VENLAFAXINE HCL ER 75 MG PO CP24
75.0000 mg | ORAL_CAPSULE | Freq: Two times a day (BID) | ORAL | 3 refills | Status: DC
  Filled 2024-03-24 (×2): qty 180, 90d supply, fill #0

## 2024-04-01 ENCOUNTER — Other Ambulatory Visit (HOSPITAL_COMMUNITY): Payer: Self-pay

## 2024-04-01 ENCOUNTER — Other Ambulatory Visit: Payer: Self-pay | Admitting: Physician Assistant

## 2024-04-01 MED ORDER — VENLAFAXINE HCL ER 75 MG PO CP24
75.0000 mg | ORAL_CAPSULE | Freq: Two times a day (BID) | ORAL | 1 refills | Status: AC
  Filled 2024-04-01 – 2024-06-15 (×2): qty 180, 90d supply, fill #0
  Filled 2024-07-15 – 2024-09-23 (×2): qty 180, 90d supply, fill #1

## 2024-04-01 NOTE — Progress Notes (Signed)
 Effexor  refilled per request.  Pt will f/u.

## 2024-04-21 ENCOUNTER — Other Ambulatory Visit (HOSPITAL_COMMUNITY): Payer: Self-pay

## 2024-04-21 ENCOUNTER — Other Ambulatory Visit: Payer: Self-pay

## 2024-05-17 ENCOUNTER — Other Ambulatory Visit: Payer: Self-pay

## 2024-06-15 ENCOUNTER — Other Ambulatory Visit: Payer: Self-pay

## 2024-06-15 ENCOUNTER — Other Ambulatory Visit (HOSPITAL_COMMUNITY): Payer: Self-pay

## 2024-06-16 ENCOUNTER — Other Ambulatory Visit (HOSPITAL_COMMUNITY): Payer: Self-pay

## 2024-06-16 ENCOUNTER — Encounter (HOSPITAL_COMMUNITY): Payer: Self-pay

## 2024-06-16 MED ORDER — CLONAZEPAM 0.5 MG PO TABS
1.0000 mg | ORAL_TABLET | Freq: Every evening | ORAL | 1 refills | Status: AC
  Filled 2024-06-16: qty 60, 30d supply, fill #0
  Filled 2024-07-15: qty 60, 30d supply, fill #1

## 2024-06-16 MED ORDER — CLONAZEPAM 0.5 MG PO TABS
1.0000 mg | ORAL_TABLET | Freq: Every day | ORAL | 0 refills | Status: DC
  Filled 2024-06-16 – 2024-09-02 (×2): qty 30, 15d supply, fill #0

## 2024-07-08 ENCOUNTER — Other Ambulatory Visit: Payer: Self-pay

## 2024-07-08 ENCOUNTER — Other Ambulatory Visit (HOSPITAL_COMMUNITY): Payer: Self-pay

## 2024-07-08 MED ORDER — AMOXICILLIN-POT CLAVULANATE 875-125 MG PO TABS
1.0000 | ORAL_TABLET | Freq: Two times a day (BID) | ORAL | 0 refills | Status: AC
  Filled 2024-07-08: qty 14, 7d supply, fill #0

## 2024-07-18 ENCOUNTER — Other Ambulatory Visit: Payer: Self-pay

## 2024-07-18 ENCOUNTER — Other Ambulatory Visit (HOSPITAL_COMMUNITY): Payer: Self-pay

## 2024-08-15 ENCOUNTER — Other Ambulatory Visit (HOSPITAL_COMMUNITY): Payer: Self-pay

## 2024-08-16 ENCOUNTER — Other Ambulatory Visit (HOSPITAL_COMMUNITY): Payer: Self-pay

## 2024-08-16 ENCOUNTER — Encounter (HOSPITAL_COMMUNITY): Payer: Self-pay

## 2024-08-16 MED ORDER — CLONAZEPAM 0.5 MG PO TABS
1.0000 mg | ORAL_TABLET | Freq: Every day | ORAL | 0 refills | Status: AC
  Filled 2024-08-16: qty 30, 15d supply, fill #0

## 2024-09-02 ENCOUNTER — Other Ambulatory Visit (HOSPITAL_COMMUNITY): Payer: Self-pay

## 2024-09-13 ENCOUNTER — Other Ambulatory Visit: Payer: Self-pay

## 2024-09-13 ENCOUNTER — Other Ambulatory Visit (HOSPITAL_COMMUNITY): Payer: Self-pay

## 2024-09-19 ENCOUNTER — Other Ambulatory Visit (HOSPITAL_COMMUNITY): Payer: Self-pay

## 2024-09-20 ENCOUNTER — Other Ambulatory Visit (HOSPITAL_COMMUNITY): Payer: Self-pay

## 2024-09-22 ENCOUNTER — Other Ambulatory Visit (HOSPITAL_COMMUNITY): Payer: Self-pay

## 2024-09-22 MED ORDER — CLONAZEPAM 0.5 MG PO TABS
1.0000 mg | ORAL_TABLET | Freq: Every day | ORAL | 0 refills | Status: AC
  Filled 2024-09-22: qty 30, 15d supply, fill #0

## 2024-09-23 ENCOUNTER — Other Ambulatory Visit (HOSPITAL_COMMUNITY): Payer: Self-pay

## 2024-10-07 ENCOUNTER — Other Ambulatory Visit (HOSPITAL_COMMUNITY): Payer: Self-pay

## 2024-10-07 ENCOUNTER — Other Ambulatory Visit: Payer: Self-pay

## 2024-10-07 MED ORDER — CLONAZEPAM 0.5 MG PO TABS
1.0000 mg | ORAL_TABLET | Freq: Every day | ORAL | 4 refills | Status: AC
  Filled 2024-10-07 (×2): qty 60, 30d supply, fill #0
  Filled 2024-11-07: qty 60, 30d supply, fill #1
  Filled 2024-11-25: qty 60, 30d supply, fill #2

## 2024-10-11 ENCOUNTER — Other Ambulatory Visit (HOSPITAL_COMMUNITY): Payer: Self-pay

## 2024-10-11 ENCOUNTER — Encounter (HOSPITAL_COMMUNITY): Payer: Self-pay

## 2024-11-07 ENCOUNTER — Other Ambulatory Visit: Payer: Self-pay

## 2024-11-25 ENCOUNTER — Other Ambulatory Visit (HOSPITAL_COMMUNITY): Payer: Self-pay

## 2024-11-25 ENCOUNTER — Other Ambulatory Visit: Payer: Self-pay | Admitting: Physician Assistant

## 2024-11-25 MED ORDER — ROPINIROLE HCL 0.5 MG PO TABS
0.5000 mg | ORAL_TABLET | Freq: Every day | ORAL | 0 refills | Status: AC
  Filled 2024-11-25: qty 90, 90d supply, fill #0
